# Patient Record
Sex: Female | Born: 1975 | Race: White | Hispanic: No | Marital: Married | Smoking: Never smoker
Health system: Southern US, Community
[De-identification: ages and names within clinical notes are randomized; demographics above are authoritative.]

## PROBLEM LIST (undated history)

## (undated) DIAGNOSIS — M719 Bursopathy, unspecified: Secondary | ICD-10-CM

## (undated) DIAGNOSIS — E2839 Other primary ovarian failure: Secondary | ICD-10-CM

## (undated) DIAGNOSIS — F419 Anxiety disorder, unspecified: Secondary | ICD-10-CM

## (undated) DIAGNOSIS — E079 Disorder of thyroid, unspecified: Secondary | ICD-10-CM

## (undated) DIAGNOSIS — Z1379 Encounter for other screening for genetic and chromosomal anomalies: Secondary | ICD-10-CM

## (undated) DIAGNOSIS — K219 Gastro-esophageal reflux disease without esophagitis: Secondary | ICD-10-CM

## (undated) DIAGNOSIS — Z803 Family history of malignant neoplasm of breast: Secondary | ICD-10-CM

## (undated) DIAGNOSIS — Z1371 Encounter for nonprocreative screening for genetic disease carrier status: Secondary | ICD-10-CM

## (undated) DIAGNOSIS — C801 Malignant (primary) neoplasm, unspecified: Secondary | ICD-10-CM

## (undated) DIAGNOSIS — Z9189 Other specified personal risk factors, not elsewhere classified: Secondary | ICD-10-CM

## (undated) DIAGNOSIS — E039 Hypothyroidism, unspecified: Secondary | ICD-10-CM

## (undated) DIAGNOSIS — E288 Other ovarian dysfunction: Secondary | ICD-10-CM

## (undated) HISTORY — DX: Other primary ovarian failure: E28.39

## (undated) HISTORY — DX: Bursopathy, unspecified: M71.9

## (undated) HISTORY — DX: Encounter for nonprocreative screening for genetic disease carrier status: Z13.71

## (undated) HISTORY — DX: Malignant (primary) neoplasm, unspecified: C80.1

## (undated) HISTORY — DX: Disorder of thyroid, unspecified: E07.9

## (undated) HISTORY — DX: Family history of malignant neoplasm of breast: Z80.3

## (undated) HISTORY — DX: Anxiety disorder, unspecified: F41.9

## (undated) HISTORY — DX: Gastro-esophageal reflux disease without esophagitis: K21.9

## (undated) HISTORY — DX: Other ovarian dysfunction: E28.8

## (undated) HISTORY — DX: Other specified personal risk factors, not elsewhere classified: Z91.89

## (undated) HISTORY — DX: Encounter for other screening for genetic and chromosomal anomalies: Z13.79

## (undated) HISTORY — PX: BASAL CELL CARCINOMA EXCISION: SHX1214

## (undated) HISTORY — PX: WISDOM TOOTH EXTRACTION: SHX21

---

## 2009-04-18 LAB — HM PAP SMEAR: HM PAP: NEGATIVE

## 2011-07-13 ENCOUNTER — Ambulatory Visit: Payer: Self-pay

## 2012-06-11 IMAGING — US US OUTSIDE FILMS BREAST
1 series · 5 of 5 positions shown · non-contrast
Comparison: none

[Series 1: us outside films breast · 5 of 5 slices shown]
[im 1/5]
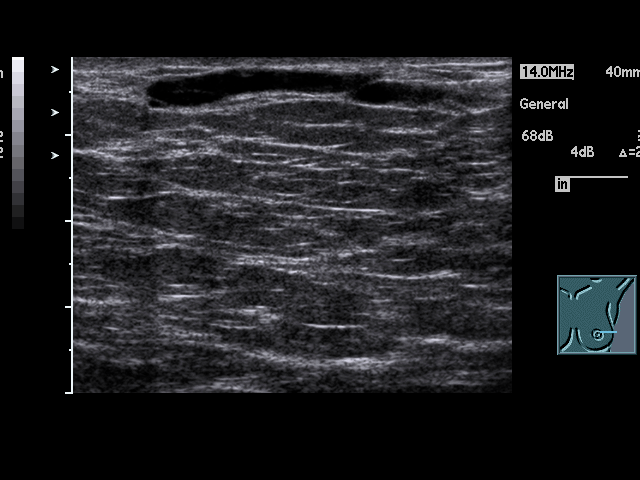
[im 2/5]
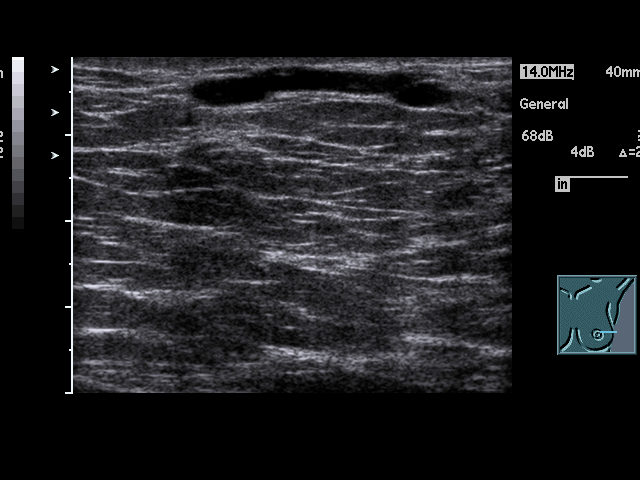
[im 3/5]
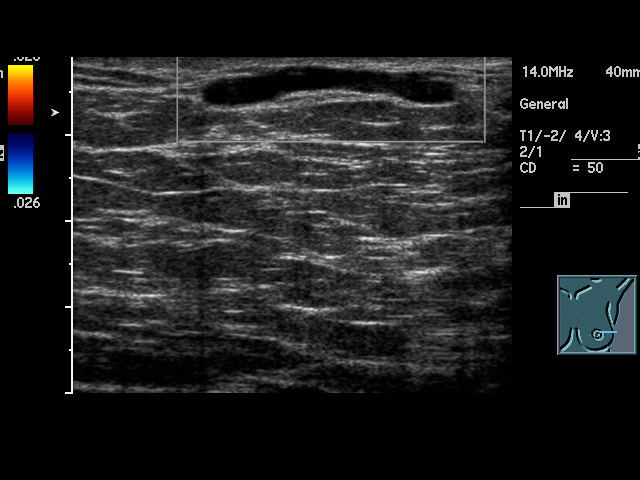
[im 4/5]
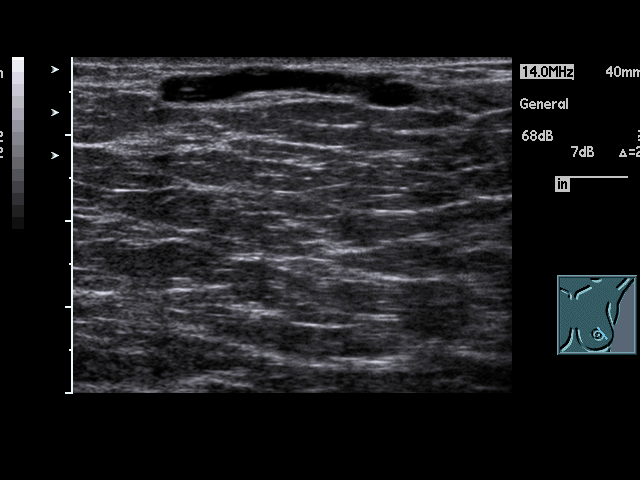
[im 5/5]
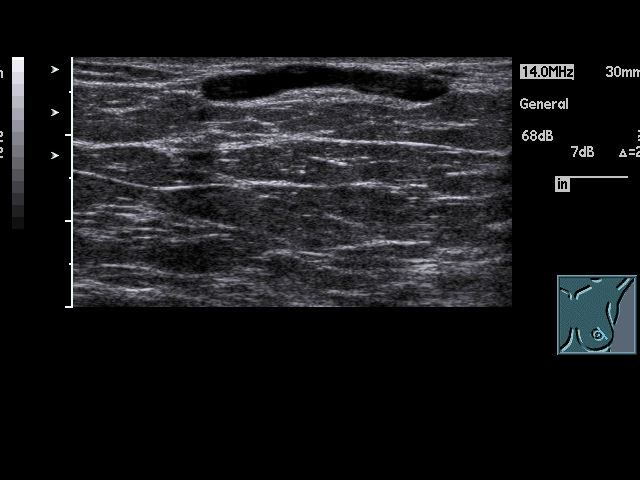

[5 of 5 positions shown; findings below may reference images not displayed]

IMAGES IMPORTED FROM THE [employer] WORKFLOW SYSTEM
NO DICTATION FOR STUDY

## 2012-08-05 DIAGNOSIS — Z803 Family history of malignant neoplasm of breast: Secondary | ICD-10-CM | POA: Insufficient documentation

## 2012-09-21 DIAGNOSIS — Z803 Family history of malignant neoplasm of breast: Secondary | ICD-10-CM | POA: Insufficient documentation

## 2012-10-15 HISTORY — PX: BREAST SURGERY: SHX581

## 2012-11-03 DIAGNOSIS — Z1239 Encounter for other screening for malignant neoplasm of breast: Secondary | ICD-10-CM | POA: Insufficient documentation

## 2012-11-25 ENCOUNTER — Ambulatory Visit: Payer: Self-pay | Admitting: Family Medicine

## 2013-05-24 ENCOUNTER — Ambulatory Visit: Payer: Self-pay

## 2014-02-22 DIAGNOSIS — E669 Obesity, unspecified: Secondary | ICD-10-CM | POA: Insufficient documentation

## 2014-02-22 DIAGNOSIS — E039 Hypothyroidism, unspecified: Secondary | ICD-10-CM | POA: Insufficient documentation

## 2014-11-02 ENCOUNTER — Ambulatory Visit: Payer: Self-pay | Admitting: Family Medicine

## 2014-12-17 IMAGING — CR DG CHEST 2V
1 series · 2 of 2 positions shown · non-contrast
Comparison: none

REASON FOR EXAM: chest pain
COMMENTS:

PROCEDURE:     MDR - MDR CHEST PA(OR AP) AND LATERAL  - November 25, 2012  [DATE]
RESULT:     Comparison: None

[Series 1: pa · 0.17mm/px · 2 of 2 slices shown]
[im 1/2]
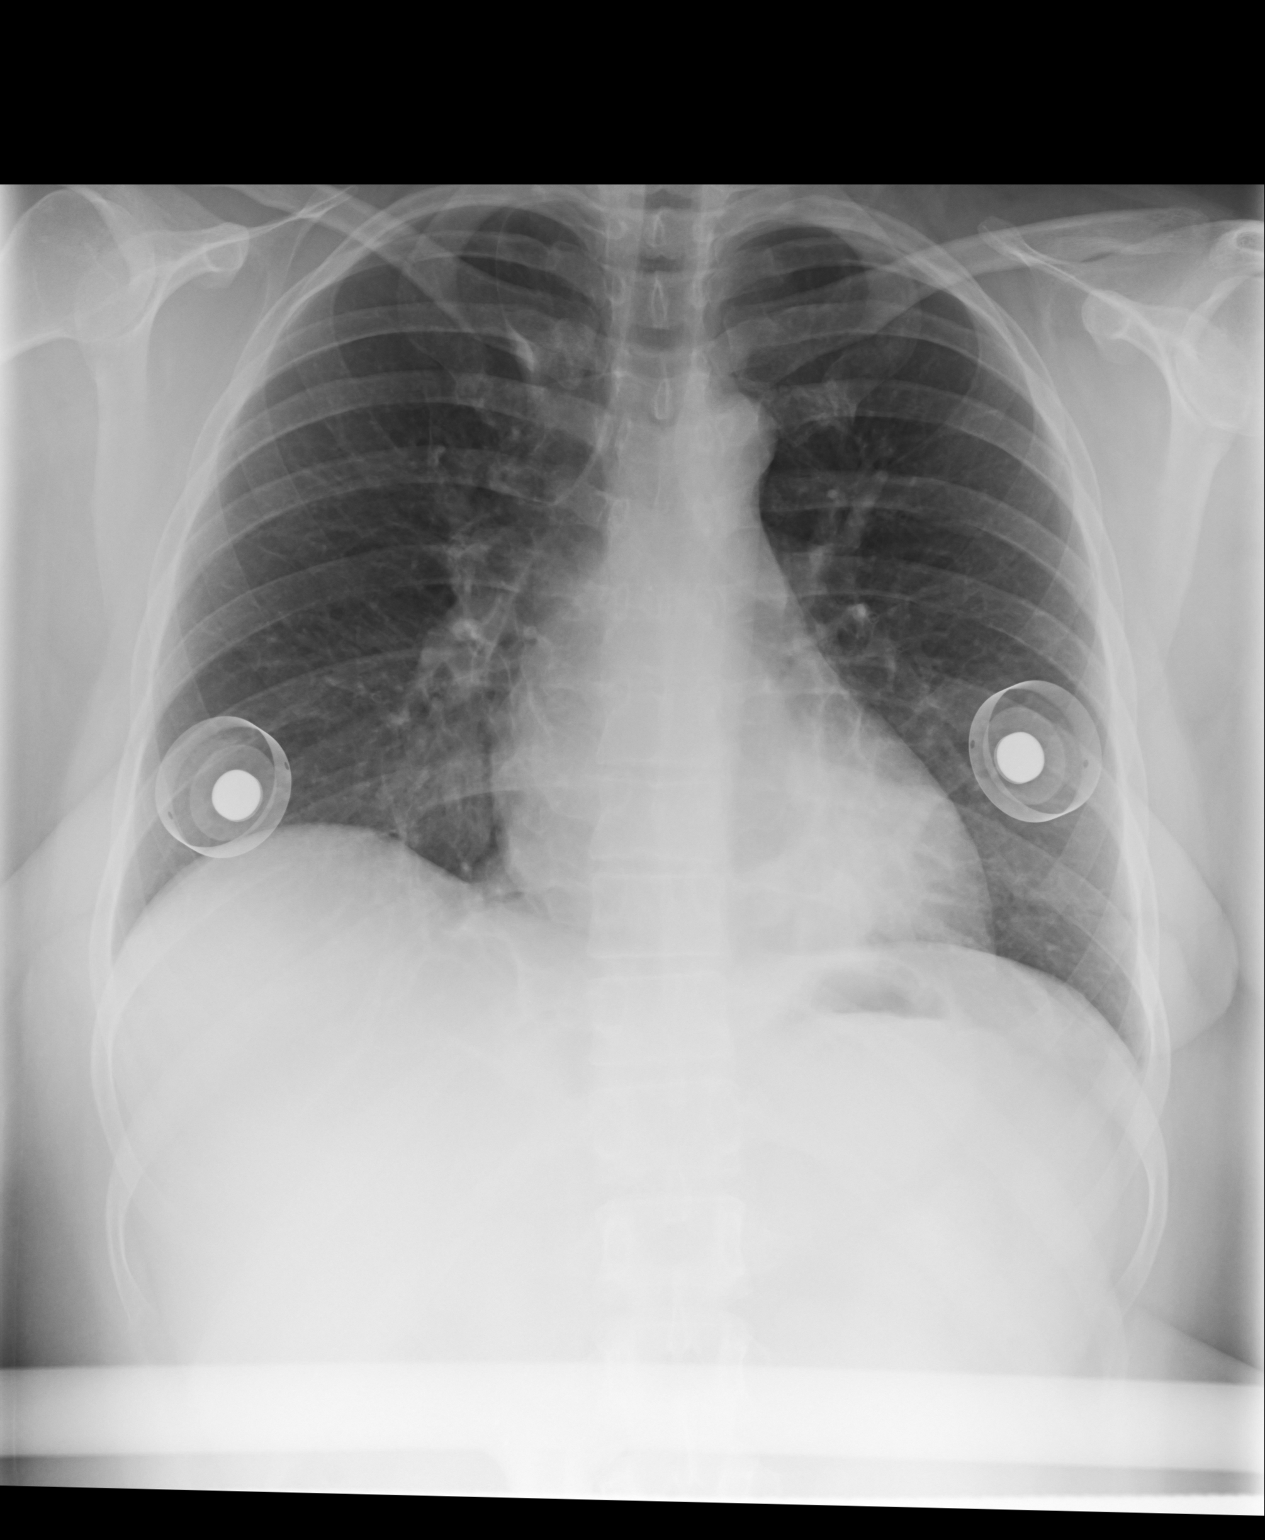
[im 2/2]
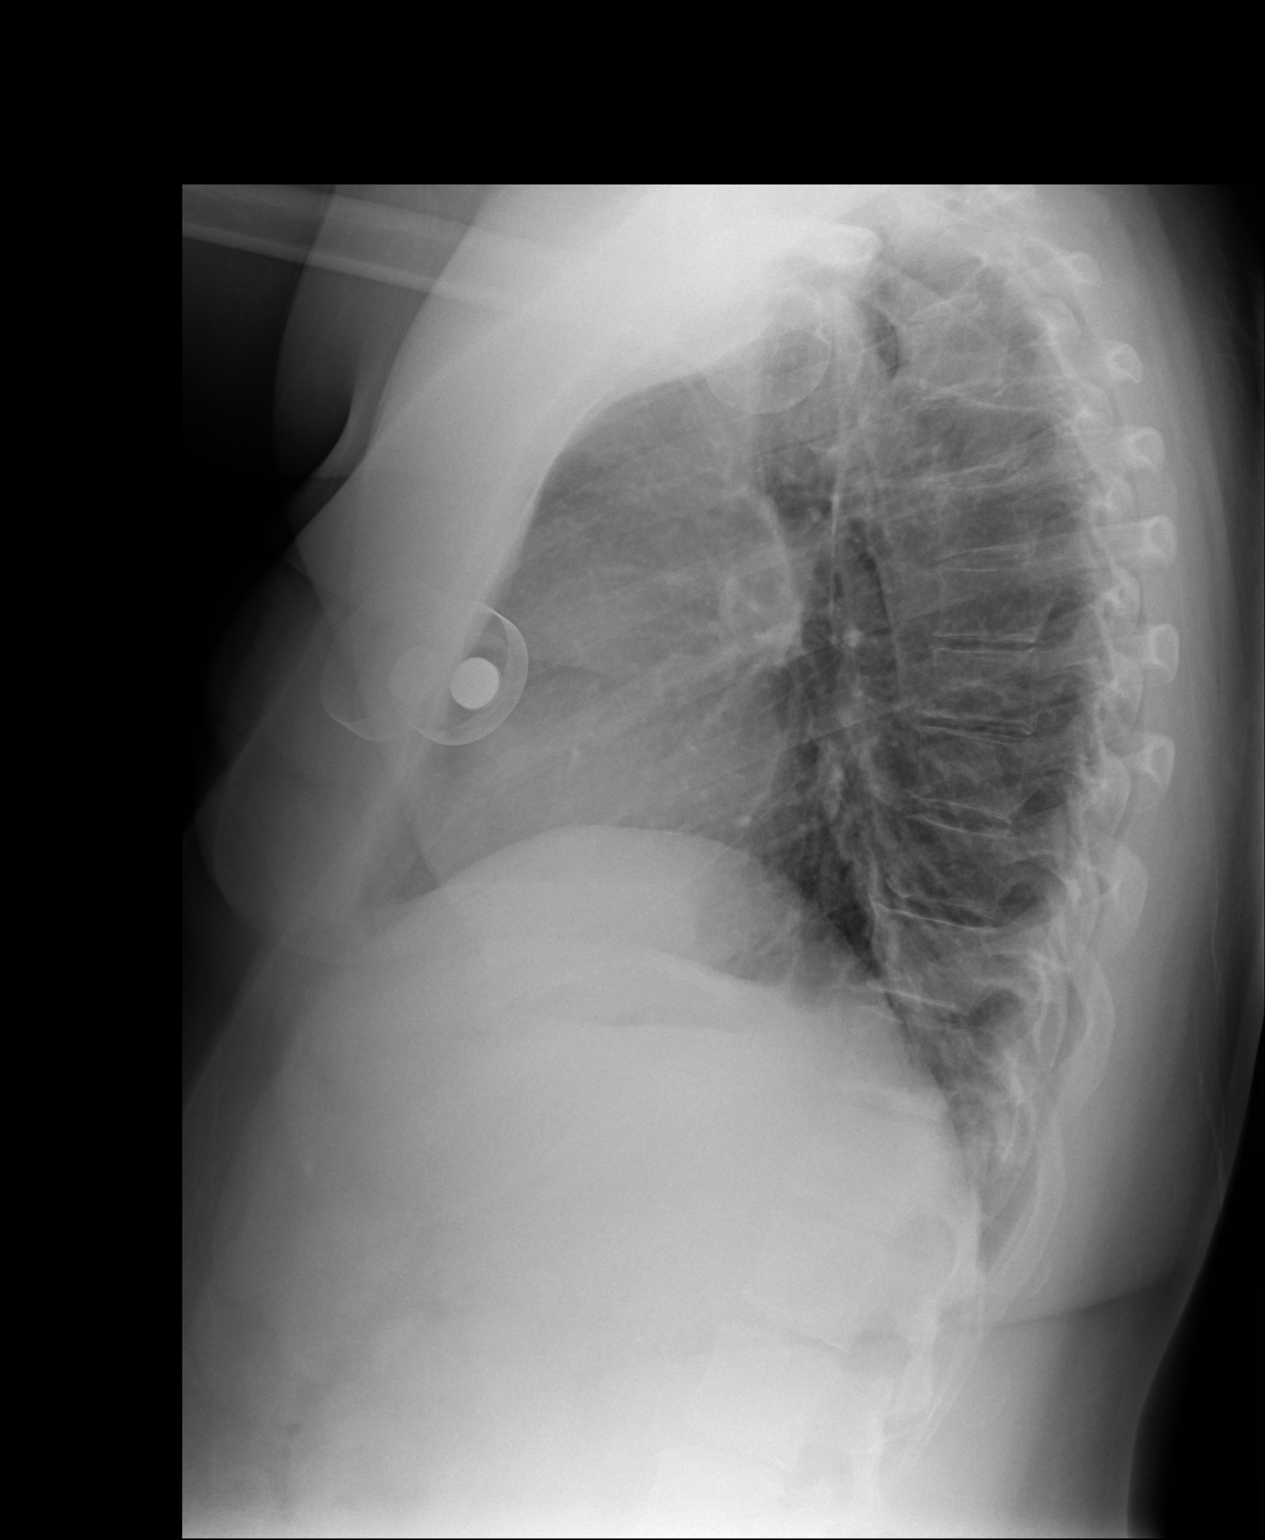

[2 of 2 positions shown; findings below may reference images not displayed]

FINDINGS: PA and lateral chest radiographs are provided.  There is no focal
parenchymal opacity, pleural effusion, or pneumothorax. The heart and
mediastinum are unremarkable.  The osseous structures are unremarkable.
IMPRESSION: No acute disease of the che[REDACTED]

## 2016-06-26 ENCOUNTER — Encounter: Payer: Self-pay | Admitting: Family Medicine

## 2016-06-26 ENCOUNTER — Ambulatory Visit (INDEPENDENT_AMBULATORY_CARE_PROVIDER_SITE_OTHER): Payer: 59 | Admitting: Family Medicine

## 2016-06-26 VITALS — BP 124/70 | HR 72 | Temp 98.2°F | Resp 16 | Ht 63.0 in | Wt 211.0 lb

## 2016-06-26 DIAGNOSIS — E039 Hypothyroidism, unspecified: Secondary | ICD-10-CM

## 2016-06-26 DIAGNOSIS — G5603 Carpal tunnel syndrome, bilateral upper limbs: Secondary | ICD-10-CM | POA: Diagnosis not present

## 2016-06-26 NOTE — Patient Instructions (Signed)
Hypothyroidism Hypothyroidism is a disorder of the thyroid. The thyroid is a large gland that is located in the lower front of the neck. The thyroid releases hormones that control how the body works. With hypothyroidism, the thyroid does not make enough of these hormones. CAUSES Causes of hypothyroidism may include:  Viral infections.  Pregnancy.  Your own defense system (immune system) attacking your thyroid.  Certain medicines.  Birth defects.  Past radiation treatments to your head or neck.  Past treatment with radioactive iodine.  Past surgical removal of part or all of your thyroid.  Problems with the gland that is located in the center of your brain (pituitary). SIGNS AND SYMPTOMS Signs and symptoms of hypothyroidism may include:  Feeling as though you have no energy (lethargy).  Inability to tolerate cold.  Weight gain that is not explained by a change in diet or exercise habits.  Dry skin.  Coarse hair.  Menstrual irregularity.  Slowing of thought processes.  Constipation.  Sadness or depression. DIAGNOSIS  Your health care provider may diagnose hypothyroidism with blood tests and ultrasound tests. TREATMENT Hypothyroidism is treated with medicine that replaces the hormones that your body does not make. After you begin treatment, it may take several weeks for symptoms to go away. HOME CARE INSTRUCTIONS   Take medicines only as directed by your health care provider.  If you start taking any new medicines, tell your health care provider.  Keep all follow-up visits as directed by your health care provider. This is important. As your condition improves, your dosage needs may change. You will need to have blood tests regularly so that your health care provider can watch your condition. SEEK MEDICAL CARE IF:  Your symptoms do not get better with treatment.  You are taking thyroid replacement medicine and:  You sweat excessively.  You have tremors.  You  feel anxious.  You lose weight rapidly.  You cannot tolerate heat.  You have emotional swings.  You have diarrhea.  You feel weak. SEEK IMMEDIATE MEDICAL CARE IF:   You develop chest pain.  You develop an irregular heartbeat.  You develop a rapid heartbeat.   This information is not intended to replace advice given to you by your health care provider. Make sure you discuss any questions you have with your health care provider.   Document Released: 08/03/2005 Document Revised: 08/24/2014 Document Reviewed: 12/19/2013 Elsevier Interactive Patient Education 2016 [place] Syndrome Carpal tunnel syndrome is a condition that causes pain in your hand and arm. The carpal tunnel is a narrow area located on the palm side of your wrist. Repeated wrist motion or certain diseases may cause swelling within the tunnel. This swelling pinches the main nerve in the wrist (median nerve). CAUSES  This condition may be caused by:   Repeated wrist motions.  Wrist injuries.  Arthritis.  A cyst or tumor in the carpal tunnel.  Fluid buildup during pregnancy. Sometimes the cause of this condition is not known.  RISK FACTORS This condition is more likely to develop in:   People who have jobs that cause them to repeatedly move their wrists in the same motion, such as butchers and cashiers.  Women.  People with certain conditions, such as:  Diabetes.  Obesity.  An underactive thyroid (hypothyroidism).  Kidney failure. SYMPTOMS  Symptoms of this condition include:   A tingling feeling in your fingers, especially in your thumb, index, and middle fingers.  Tingling or numbness in your hand.  An aching  feeling in your entire arm, especially when your wrist and elbow are bent for long periods of time.  Wrist pain that goes up your arm to your shoulder.  Pain that goes down into your palm or fingers.  A weak feeling in your hands. You may have trouble grabbing and  holding items. Your symptoms may feel worse during the night.  DIAGNOSIS  This condition is diagnosed with a medical history and physical exam. You may also have tests, including:   An electromyogram (EMG). This test measures electrical signals sent by your nerves into the muscles.  X-rays. TREATMENT  Treatment for this condition includes:  Lifestyle changes. It is important to stop doing or modify the activity that caused your condition.  Physical or occupational therapy.  Medicines for pain and inflammation. This may include medicine that is injected into your wrist.  A wrist splint.  Surgery. HOME CARE INSTRUCTIONS  If You Have a Splint:  Wear it as told by your health care provider. Remove it only as told by your health care provider.  Loosen the splint if your fingers become numb and tingle, or if they turn cold and blue.  Keep the splint clean and dry. General Instructions  Take over-the-counter and prescription medicines only as told by your health care provider.  Rest your wrist from any activity that may be causing your pain. If your condition is work related, talk to your employer about changes that can be made, such as getting a wrist pad to use while typing.  If directed, apply ice to the painful area:  Put ice in a plastic bag.  Place a towel between your skin and the bag.  Leave the ice on for 20 minutes, 2-3 times per day.  Keep all follow-up visits as told by your health care provider. This is important.  Do any exercises as told by your health care provider, physical therapist, or occupational therapist. [place] IF:   You have new symptoms.  Your pain is not controlled with medicines.  Your symptoms get worse.   This information is not intended to replace advice given to you by your health care provider. Make sure you discuss any questions you have with your health care provider.   Document Released: 07/31/2000 Document Revised:  04/24/2015 Document Reviewed: 12/19/2014 Elsevier Interactive Patient Education [employer].

## 2016-06-26 NOTE — Progress Notes (Signed)
Patient: Gloria Li Female    DOB: 07/17/1976   40 y.o.   MRN: [id number] Visit Date: 06/26/2016  Today's Provider: Vernie Murders, PA   Chief Complaint  Patient presents with  . Hypothyroidism  . Hand Problem    numbness and tingling in thumbs   Subjective:    HPI Pt is here today to discuss lab work for her endocrinologist and see if she can start getting her rx filled through [place]. She has labs that were done in May.  Her thyroid has been stable. The only problem she is having today is she has numbness and tingling in her hands when she is doing anything. 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 She reports that it has been going on for a while but has gotten worse over the last year.     Patient Active Problem List   Diagnosis Date Noted  . Hypothyroidism 02/22/2014  . Obesity 02/22/2014  . Breast cancer screening, high risk patient 11/03/2012  . Family history of breast cancer in mother 09/21/2012  . Family history of breast cancer in first degree relative 08/05/2012   Past Surgical History:  Procedure Laterality Date  . BREAST SURGERY Bilateral 10/2012   Mother had breast cancer masectomy was for prophylactically. later in 2014 she had breast implants.   . CESAREAN SECTION  2002   Family History  Problem Relation Age of Onset  . Cancer Mother   . Glaucoma Father     Allergies  Allergen Reactions  . Penicillins Rash    As an infant  . Sulfa Antibiotics Rash    Current Outpatient Prescriptions:  .  Cholecalciferol (VITAMIN D3) 1000 units CAPS, Take by mouth daily., Disp: , Rfl:  .  Cyanocobalamin (VITAMIN B 12 PO), Take by mouth., Disp: , Rfl:  .  levothyroxine (SYNTHROID, LEVOTHROID) 100 MCG tablet, Take by mouth., Disp: , Rfl:   Review of Systems  Constitutional: Negative.   HENT: Negative.   Eyes: Negative.   Respiratory: Negative.   Cardiovascular: Negative.   Gastrointestinal: Negative.     Endocrine: Negative.   Genitourinary: Negative.   Musculoskeletal: Negative.   Skin: Negative.   Allergic/Immunologic: Negative.   Neurological: Positive for numbness.  Hematological: Negative.   Psychiatric/Behavioral: Negative.     Social History  Substance Use Topics  . Smoking status: Never Smoker  . Smokeless tobacco: Never Used  . Alcohol use No   Objective:   BP 124/70 (BP Location: Right Arm, Patient Position: Sitting, Cuff Size: Large)   Pulse 72   Temp 98.2 F (36.8 C) (Oral)   Resp 16   Ht 5\' 3"  (1.6 m)   Wt 211 lb (95.7 kg)   BMI 37.38 kg/m   Physical Exam  Constitutional: She is oriented to person, place, and time. She appears well-developed and well-nourished. No distress.  HENT:  Head: Normocephalic and atraumatic.  Right Ear: Hearing normal.  Left Ear: Hearing normal.  Nose: Nose normal.  Eyes: Conjunctivae and lids are normal. Right eye exhibits no discharge. Left eye exhibits no discharge. No scleral icterus.  Neck: Neck supple. Thyromegaly present.  Cardiovascular: Normal rate and regular rhythm.   Pulmonary/Chest: Effort normal and breath sounds normal. No respiratory distress.  Abdominal: Soft. Bowel sounds are normal.  Musculoskeletal: Normal range of motion.  Neurological: She is alert and oriented to person, place, and time. She has normal reflexes.  Bilateral tingling/numbness in both hands in the median nerve distribution. DTR's are symmetric. Questionably positive Phalen signs. Grip is symmetric with normal 2+ pulses.  Skin: Skin is intact. No lesion and no rash noted.  Psychiatric: She has a normal mood and affect. Her speech is normal and behavior is normal. Thought content normal.      Assessment & Plan:     1. Bilateral carpal tunnel syndrome Progressive tingling/numbness in both hands over the past few weeks. Suspect CTS from repetitive use at work typing. Recommend using wrist splints for support and OTC NSAID of choice. If no better  in the next 3-4 weeks, will need referral to hand specialist for possible cortisone injections.  2. Hypothyroidism, unspecified type Tolerating Levothyroxine 100 mcg qd prescribed by Dr. Eddie Dibbles (endocrinologist). TSH was 2.020 on 12-25-15. Denies tremor, palpitations, sweats, constipation, edema or hair loss. Continue present dosage and follow up every 6 months.       Vernie Murders, PA  [facility]

## 2016-08-04 ENCOUNTER — Encounter: Payer: Self-pay | Admitting: Family Medicine

## 2016-09-01 ENCOUNTER — Ambulatory Visit (INDEPENDENT_AMBULATORY_CARE_PROVIDER_SITE_OTHER): Payer: 59 | Admitting: Family Medicine

## 2016-09-01 ENCOUNTER — Encounter: Payer: Self-pay | Admitting: Family Medicine

## 2016-09-01 VITALS — BP 104/76 | HR 68 | Temp 97.8°F | Resp 14 | Wt 206.2 lb

## 2016-09-01 DIAGNOSIS — F4323 Adjustment disorder with mixed anxiety and depressed mood: Secondary | ICD-10-CM | POA: Diagnosis not present

## 2016-09-01 MED ORDER — SERTRALINE HCL 50 MG PO TABS: 50.0000 mg | ORAL_TABLET | Freq: Every day | ORAL | 3 refills | Status: DC

## 2016-09-01 NOTE — Progress Notes (Signed)
Patient: Gloria Li Female    DOB: November 25, 1975   41 y.o.   MRN: [id number] Visit Date: 09/01/2016  Today's Provider: Vernie Murders, PA   Chief Complaint  Patient presents with  . Anxiety   Subjective:    Anxiety  Presents for initial visit. Episode onset: Friday morning. Progression since onset: slight improvement. Symptoms include depressed mood, excessive worry, irritability, malaise, nausea, nervous/anxious behavior, panic and restlessness. Primary symptoms comment: appetite change, uncontrollable crying, sleeping more. The severity of symptoms is interfering with daily activities. The symptoms are aggravated by work stress. The quality of sleep is poor. Nighttime awakenings: one to two.   Risk factors include a major life event (change positions at work). Past treatments include nothing.   Past Medical History:  Diagnosis Date  . Thyroid disease    Patient Active Problem List   Diagnosis Date Noted  . Hypothyroidism 02/22/2014  . Obesity 02/22/2014  . Breast cancer screening, high risk patient 11/03/2012  . Family history of breast cancer in mother 09/21/2012  . Family history of breast cancer in first degree relative 08/05/2012   Past Surgical History:  Procedure Laterality Date  . BREAST SURGERY Bilateral 10/2012   Mother had breast cancer masectomy was for prophylactically. later in 2014 she had breast implants.   . CESAREAN SECTION  2002   Family History  Problem Relation Age of Onset  . Cancer Mother   . Glaucoma Father    Allergies  Allergen Reactions  . Penicillins Rash    As an infant  . Sulfa Antibiotics Rash     Previous Medications   CHOLECALCIFEROL (VITAMIN D3) 1000 UNITS CAPS    Take by mouth daily.   CYANOCOBALAMIN (VITAMIN B 12 PO)    Take by mouth.   LEVOTHYROXINE (SYNTHROID, LEVOTHROID) 100 MCG TABLET    Take by mouth.   PHENTERMINE 30 MG CAPSULE    TK 1 C PO QAM BEFORE BREAKFAST    Review of Systems  Constitutional: Positive for  irritability.  Respiratory: Negative.   Cardiovascular: Negative.   Gastrointestinal: Positive for nausea.  Psychiatric/Behavioral: Positive for agitation, dysphoric mood and sleep disturbance. The patient is nervous/anxious.     Social History  Substance Use Topics  . Smoking status: Never Smoker  . Smokeless tobacco: Never Used  . Alcohol use No   Objective:   BP 104/76 (BP Location: Right Arm, Patient Position: Sitting, Cuff Size: Normal)   Pulse 68   Temp 97.8 F (36.6 C) (Oral)   Resp 14   Wt 206 lb 3.2 oz (93.5 kg)   BMI 36.53 kg/m  LMP 12 years ago.  Physical Exam  Constitutional: She is oriented to person, place, and time. She appears well-developed and well-nourished. No distress.  HENT:  Head: Normocephalic and atraumatic.  Right Ear: Hearing and external ear normal.  Left Ear: Hearing and external ear normal.  Nose: Nose normal.  Mouth/Throat: Oropharynx is clear and moist.  Eyes: Conjunctivae and lids are normal. Right eye exhibits no discharge. Left eye exhibits no discharge. No scleral icterus.  Neck: Neck supple. No thyromegaly present.  Cardiovascular: Normal rate and regular rhythm.   Pulmonary/Chest: Effort normal. No respiratory distress.  Abdominal: Soft. Bowel sounds are normal.  Musculoskeletal: Normal range of motion.  Lymphadenopathy:    She has no cervical adenopathy.  Neurological: She is alert and oriented to person, place, and time. She has normal reflexes.  Skin: Skin is intact. No lesion and no rash noted.  Psychiatric:  Her speech is normal and behavior is normal. Thought content normal. Her mood appears anxious. She exhibits a depressed mood.      Assessment & Plan:     1. Adjustment reaction with anxiety and depression Onset over the past weekend with anxiety, spontaneous crying spells, hypersomnia and feeling sad. This is also close to the anniversary of her mother's death, this patient's bilateral mastectomies and she is in the process  of selling her home. 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 Advised her to get into counseling through [facility], eat 3 meals a day and exercise regularly. Will give Sertraline 50 mg qd and recheck in 2 weeks. - sertraline (ZOLOFT) 50 MG tablet; Take 1 tablet (50 mg total) by mouth daily.  Dispense: 30 tablet; Refill: 3

## 2016-09-01 NOTE — Patient Instructions (Signed)
Generalized Anxiety Disorder Generalized anxiety disorder (GAD) is a mental disorder. It interferes with life functions, including relationships, work, and school. GAD is different from normal anxiety, which everyone experiences at some point in their lives in response to specific life events and activities. Normal anxiety actually helps [place] prepare for and get through these life events and activities. Normal anxiety goes away after the event or activity is over.  GAD causes anxiety that is not necessarily related to specific events or activities. It also causes excess anxiety in proportion to specific events or activities. The anxiety associated with GAD is also difficult to control. GAD can vary from mild to severe. People with severe GAD can have intense waves of anxiety with physical symptoms (panic attacks).  SYMPTOMS The anxiety and worry associated with GAD are difficult to control. This anxiety and worry are related to many life events and activities and also occur more days than not for 6 months or longer. People with GAD also have three or more of the following symptoms (one or more in children):  Restlessness.   Fatigue.  Difficulty concentrating.   Irritability.  Muscle tension.  Difficulty sleeping or unsatisfying sleep. DIAGNOSIS GAD is diagnosed through an assessment by your health care provider. Your health care provider will ask you questions aboutyour mood,physical symptoms, and events in your life. Your health care provider may ask you about your medical history and use of alcohol or drugs, including prescription medicines. Your health care provider may also do a physical exam and blood tests. Certain medical conditions and the use of certain substances can cause symptoms similar to those associated with GAD. Your health care provider may refer you to a mental health specialist for further evaluation. TREATMENT The following therapies are usually used to treat GAD:    Medication. Antidepressant medication usually is prescribed for long-term daily control. Antianxiety medicines may be added in severe cases, especially when panic attacks occur.   Talk therapy (psychotherapy). Certain types of talk therapy can be helpful in treating GAD by providing support, education, and guidance. A form of talk therapy called cognitive behavioral therapy can teach you healthy ways to think about and react to daily life events and activities.  Stress managementtechniques. These include yoga, meditation, and exercise and can be very helpful when they are practiced regularly. A mental health specialist can help determine which treatment is best for you. Some people see improvement with one therapy. However, other people require a combination of therapies. This information is not intended to replace advice given to you by your health care provider. Make sure you discuss any questions you have with your health care provider. Document Released: 11/28/2012 Document Revised: 08/24/2014 Document Reviewed: 11/28/2012 Elsevier Interactive Patient Education  2017 [place]. Major Depressive Disorder, Adult Major depressive disorder (MDD) is a mental health condition. It may also be called clinical depression or unipolar depression. MDD usually causes feelings of sadness, hopelessness, or helplessness. MDD can also cause physical symptoms. It can interfere with work, school, relationships, and other everyday activities. MDD may be mild, moderate, or severe. It may occur once (single episode major depressive disorder) or it may occur multiple times (recurrent major depressive disorder). What are the causes? The exact cause of this condition is not known. MDD is most likely caused by a combination of things, which may include:  Genetic factors. These are traits that are passed along from parent to child.  Individual factors. Your personality, your behavior, and the way you handle your  thoughts and feelings may contribute to MDD. This includes personality traits and behaviors learned from others.  Physical factors, such as:  Differences in the part of your brain that controls emotion. This part of your brain may be different than it is in people who do not have MDD.  Long-term (chronic) medical or psychiatric illnesses.  Social factors. Traumatic experiences or major life changes may play a role in the development of MDD. What increases the risk? This condition is more likely to develop in women. The following factors may also make you more likely to develop MDD:  A family history of depression.  Troubled family relationships.  Abnormally low levels of certain brain chemicals.  Traumatic events in childhood, especially abuse or the loss of a parent.  Being under a lot of stress, or long-term stress, especially from upsetting life experiences or losses.  A history of:  Chronic physical illness.  Other mental health disorders.  Substance abuse.  Poor living conditions.  Experiencing social exclusion or discrimination on a regular basis. What are the signs or symptoms? The main symptoms of MDD typically include:  Constant depressed or irritable mood.  Loss of interest in things and activities. MDD symptoms may also include:  Sleeping or eating too much or too little.  Unexplained weight change.  Fatigue or low energy.  Feelings of worthlessness or guilt.  Difficulty thinking clearly or making decisions.  Thoughts of suicide or of harming others.  Physical agitation or weakness.  Isolation. Severe cases of MDD may also occur with other symptoms, such as:  Delusions or hallucinations, in which you imagine things that are not real (psychotic depression).  Low-level depression that lasts at least a year (chronic depression or persistent depressive disorder).  Extreme sadness and hopelessness (melancholic depression).  Trouble speaking and  moving (catatonic depression). How is this diagnosed? This condition may be diagnosed based on:  Your symptoms.  Your medical history, including your mental health history. This may involve tests to evaluate your mental health. You may be asked questions about your lifestyle, including any drug and alcohol use, and how long you have had symptoms of MDD.  A physical exam.  Blood tests to rule out other conditions. You must have a depressed mood and at least four other MDD symptoms most of the day, nearly every day in the same 2-week timeframe before your health care provider can confirm a diagnosis of MDD. How is this treated? This condition is usually treated by mental health professionals, such as psychologists, psychiatrists, and clinical social workers. You may need more than one type of treatment. Treatment may include:  Psychotherapy. This is also called talk therapy or counseling. Types of psychotherapy include:  Cognitive behavioral therapy (CBT). This type of therapy teaches you to recognize unhealthy feelings, thoughts, and behaviors, and replace them with positive thoughts and actions.  Interpersonal therapy (IPT). This helps you to improve the way you relate to and communicate with others.  Family therapy. This treatment includes members of your family.  Medicine to treat anxiety and depression, or to help you control certain emotions and behaviors.  Lifestyle changes, such as:  Limiting alcohol and drug use.  Exercising regularly.  Getting plenty of sleep.  Making healthy eating choices.  Spending more time outdoors. Treatments involving stimulation of the brain can be used in situations with extremely severe symptoms, or when medicine or other therapies do not work over time. These treatments include electroconvulsive therapy, transcranial magnetic stimulation, and vagal nerve stimulation.  Follow these instructions at home: Activity  Return to your normal  activities as told by your health care provider.  Exercise regularly and spend time outdoors as told by your health care provider. General instructions  Take over-the-counter and prescription medicines only as told by your health care provider.  Do not drink alcohol. If you drink alcohol, limit your alcohol intake to no more than 1 drink a day for nonpregnant women and 2 drinks a day for men. One drink equals 12 oz of beer, 5 oz of wine, or 1 oz of hard liquor. Alcohol can affect any antidepressant medicines you are taking. Talk to your health care provider about your alcohol use.  Eat a healthy diet and get plenty of sleep.  Find activities that you enjoy doing, and make time to do them.  Consider joining a support group. Your health care provider may be able to recommend a support group.  Keep all follow-up visits as told by your health care provider. This is important. Where to find more information: [employer] on Mental Illness  www.nami.org [place]. National Institute of Mental Health  https://carter.com/ National Suicide Prevention Lifeline  1-800-273-TALK 423-226-2400). This is free, 24-hour help. Contact a health care provider if:  Your symptoms get worse.  You develop new symptoms. Get help right away if:  You self-harm.  You have serious thoughts about hurting yourself or others.  You see, hear, taste, smell, or feel things that are not present (hallucinate). This information is not intended to replace advice given to you by your health care provider. Make sure you discuss any questions you have with your health care provider. Document Released: 11/28/2012 Document Revised: 04/09/2016 Document Reviewed: 02/12/2016 Elsevier Interactive Patient Education  2017 [employer].

## 2016-09-15 ENCOUNTER — Encounter: Payer: Self-pay | Admitting: Family Medicine

## 2016-09-15 ENCOUNTER — Ambulatory Visit (INDEPENDENT_AMBULATORY_CARE_PROVIDER_SITE_OTHER): Payer: 59 | Admitting: Family Medicine

## 2016-09-15 VITALS — BP 110/80 | HR 68 | Temp 98.4°F | Resp 16 | Wt 208.0 lb

## 2016-09-15 DIAGNOSIS — F4323 Adjustment disorder with mixed anxiety and depressed mood: Secondary | ICD-10-CM | POA: Diagnosis not present

## 2016-09-15 NOTE — Progress Notes (Signed)
Patient: Gloria Li Female    DOB: 12-12-1975   41 y.o.   MRN: [id number] Visit Date: 09/15/2016  Today's Provider: Vernie Murders, PA   Chief Complaint  Patient presents with  . Anxiety  . Depression   Subjective:    HPI  Depression/Anxiety, Follow-up  She  was last seen for this 2 weeks ago. Changes made at last visit include start Sertraline 50 mg qd.   She reports excellent compliance with treatment. She is not having side effects.   She reports excellent tolerance of treatment. Current symptoms include: none She feels she is Improved since last visit.  ------------------------------------------------------------------------ Past Surgical History:  Procedure Laterality Date  . BREAST SURGERY Bilateral 10/2012   Mother had breast cancer masectomy was for prophylactically. later in 2014 she had breast implants.   . CESAREAN SECTION  2002    Family History  Problem Relation Age of Onset  . Cancer Mother   . Glaucoma Father     Allergies  Allergen Reactions  . Penicillins Rash    As an infant  . Sulfa Antibiotics Rash   Patient Active Problem List   Diagnosis Date Noted  . Hypothyroidism 02/22/2014  . Obesity 02/22/2014  . Breast cancer screening, high risk patient 11/03/2012  . Family history of breast cancer in mother 09/21/2012  . Family history of breast cancer in first degree relative 08/05/2012    Current Outpatient Prescriptions:  .  Cholecalciferol (VITAMIN D3) 1000 units CAPS, Take by mouth daily., Disp: , Rfl:  .  Cyanocobalamin (VITAMIN B 12 PO), Take 1 tablet by mouth daily. , Disp: , Rfl:  .  levothyroxine (SYNTHROID, LEVOTHROID) 100 MCG tablet, Take by mouth., Disp: , Rfl:  .  sertraline (ZOLOFT) 50 MG tablet, Take 1 tablet (50 mg total) by mouth daily., Disp: 30 tablet, Rfl: 3  Review of Systems  Constitutional: Negative.   Respiratory: Negative.   Cardiovascular: Negative.   Psychiatric/Behavioral: Negative.      Social History  Substance Use Topics  . Smoking status: Never Smoker  . Smokeless tobacco: Never Used  . Alcohol use No   Objective:   BP 110/80 (BP Location: Right Arm, Patient Position: Sitting, Cuff Size: Large)   Pulse 68   Temp 98.4 F (36.9 C) (Oral)   Resp 16   Wt 208 lb (94.3 kg)   SpO2 97%   BMI 36.85 kg/m   Physical Exam  Constitutional: She is oriented to person, place, and time. She appears well-developed and well-nourished. No distress.  HENT:  Head: Normocephalic and atraumatic.  Right Ear: Hearing normal.  Left Ear: Hearing normal.  Nose: Nose normal.  Eyes: Conjunctivae and lids are normal. Right eye exhibits no discharge. Left eye exhibits no discharge. No scleral icterus.  Pulmonary/Chest: Effort normal. No respiratory distress.  Musculoskeletal: Normal range of motion.  Neurological: She is alert and oriented to person, place, and time.  Skin: Skin is intact. No lesion and no rash noted.  Psychiatric: She has a normal mood and affect. Her speech is normal and behavior is normal. Thought content normal.      Assessment & Plan:     1. Adjustment reaction with anxiety and depression Much improved. Tolerating Sertraline 50 mg qd without daytime drowsiness. Eating well, good energy level, less anxiety and sadness. Employer has started the process of getting her back in her old position which was less stressful. Continue present Zoloft dosage and recheck in 3 months.  Vernie Murders, PA  [facility]

## 2016-10-30 ENCOUNTER — Other Ambulatory Visit: Payer: Self-pay | Admitting: Family Medicine

## 2016-10-30 ENCOUNTER — Telehealth: Payer: Self-pay | Admitting: Family Medicine

## 2016-10-30 DIAGNOSIS — F4323 Adjustment disorder with mixed anxiety and depressed mood: Secondary | ICD-10-CM

## 2016-10-30 MED ORDER — SERTRALINE HCL 50 MG PO TABS: 50.0000 mg | ORAL_TABLET | Freq: Every day | ORAL | 3 refills | Status: DC

## 2016-10-30 NOTE — Telephone Encounter (Signed)
Pt contacted office for refill request on the following medications:  sertraline (ZOLOFT) 50 MG tablet.  Pt is reqesting this sent to [facility] mail order.  [id number]

## 2016-10-30 NOTE — Telephone Encounter (Signed)
Done

## 2016-11-02 NOTE — Telephone Encounter (Signed)
Left patient a voicemail advising her that RX has been sent to pharmacy.

## 2016-12-01 ENCOUNTER — Ambulatory Visit (INDEPENDENT_AMBULATORY_CARE_PROVIDER_SITE_OTHER): Payer: 59 | Admitting: Family Medicine

## 2016-12-01 ENCOUNTER — Encounter: Payer: Self-pay | Admitting: Family Medicine

## 2016-12-01 VITALS — BP 112/82 | HR 87 | Temp 98.6°F | Wt 208.0 lb

## 2016-12-01 DIAGNOSIS — M25512 Pain in left shoulder: Secondary | ICD-10-CM

## 2016-12-01 DIAGNOSIS — R091 Pleurisy: Secondary | ICD-10-CM | POA: Diagnosis not present

## 2016-12-01 DIAGNOSIS — Z9013 Acquired absence of bilateral breasts and nipples: Secondary | ICD-10-CM | POA: Insufficient documentation

## 2016-12-01 NOTE — Progress Notes (Signed)
Patient: Gloria Li Female    DOB: [birth year]   41 y.o.   MRN: [id number] Visit Date: 12/01/2016  Today's Provider: Vernie Murders, PA   Chief Complaint  Patient presents with  . Follow-up   Subjective:    HPI Patient went to be evaluated for a muscle strain on 11/25/2016 at [facility]. A chest x-ray was done and patient is here to discuss results. She was told the x-ray showed streaky lung density of left lung base and advised to follow up with PCP for repeat x-ray in 2-3 weeks.. Patient brought x-ray report with her today.   Past Medical History:  Diagnosis Date  . Thyroid disease    Past Surgical History:  Procedure Laterality Date  . BREAST SURGERY Bilateral 10/2012   Mother had breast cancer masectomy was for prophylactically. later in 2014 she had breast implants.   . CESAREAN SECTION  2002   Family History  Problem Relation Age of Onset  . Cancer Mother   . Glaucoma Father    Allergies  Allergen Reactions  . Penicillins Rash    As an infant  . Sulfa Antibiotics Rash   Previous Medications   CHOLECALCIFEROL (VITAMIN D3) 1000 UNITS CAPS    Take by mouth daily.   CYANOCOBALAMIN (VITAMIN B 12 PO)    Take 1 tablet by mouth daily.    CYCLOBENZAPRINE (FLEXERIL) 5 MG TABLET    Take by mouth.   ETODOLAC (LODINE) 500 MG TABLET    Take by mouth.   LEVOTHYROXINE (SYNTHROID, LEVOTHROID) 100 MCG TABLET    Take by mouth.   SERTRALINE (ZOLOFT) 50 MG TABLET    Take 1 tablet (50 mg total) by mouth daily.    Review of Systems  Constitutional: Negative.   Respiratory: Negative.   Cardiovascular: Negative.     Social History  Substance Use Topics  . Smoking status: Never Smoker  . Smokeless tobacco: Never Used  . Alcohol use No   Objective:   BP 112/82 (BP Location: Right Arm, Patient Position: Sitting, Cuff Size: Normal)   Pulse 87   Temp 98.6 F (37 C) (Oral)   Wt 208 lb (94.3 kg)   SpO2 98%   BMI 36.85 kg/m   Physical Exam    Constitutional: She is oriented to person, place, and time. She appears well-developed and well-nourished. No distress.  HENT:  Head: Normocephalic and atraumatic.  Right Ear: Hearing normal.  Left Ear: Hearing normal.  Nose: Nose normal.  Eyes: Conjunctivae and lids are normal. Right eye exhibits no discharge. Left eye exhibits no discharge. No scleral icterus.  Neck: Neck supple.  Cardiovascular: Normal rate and regular rhythm.   Pulmonary/Chest: Effort normal and breath sounds normal. No respiratory distress. She exhibits no tenderness.  Abdominal: Soft. Bowel sounds are normal.  Musculoskeletal: Normal range of motion.  Neurological: She is alert and oriented to person, place, and time.  Skin: Skin is intact. No lesion and no rash noted.  Psychiatric: She has a normal mood and affect. Her speech is normal and behavior is normal. Thought content normal.      Assessment & Plan:     1. Pleurisy Slight soreness in left posterior base without signs of shingles rash. No fever, cough or congestion. CXR at [facility] showed some streaky density in the left lung base. Worried about possible cancer. No dyspnea, wheeze or chest pains. Will check CBC for signs of infection. Recheck in 2 weeks and repeat chest x-ray  as recommended by radiologist. - CBC with Differential/Platelet  2. Left shoulder pain, unspecified chronicity Onset 11-25-16 after carrying a vacuum cleaner down some stairs in her left hand. Was evaluated at [facility] and treated with Lodine and Flexeril. Feeling much improved with the use of the Lodine. Not using he Flexeril now. Recheck prn.  3. History of mastectomy, bilateral Had prophylactic mastectomies in 2014 with positive first degree relative having breast cancer (mother died age 86 after recurrence of metastatic breast cancer). Her BRCA was negative.

## 2016-12-02 ENCOUNTER — Encounter: Payer: Self-pay | Admitting: Family Medicine

## 2016-12-02 ENCOUNTER — Telehealth: Payer: Self-pay

## 2016-12-02 LAB — CBC WITH DIFFERENTIAL/PLATELET
BASOS ABS: 0 10*3/uL (ref 0.0–0.2)
Basos: 0 %
EOS (ABSOLUTE): 0.1 10*3/uL (ref 0.0–0.4)
Eos: 2 %
HEMOGLOBIN: 14.7 g/dL (ref 11.1–15.9)
Hematocrit: 44.5 % (ref 34.0–46.6)
IMMATURE GRANS (ABS): 0 10*3/uL (ref 0.0–0.1)
Immature Granulocytes: 0 %
LYMPHS: 20 %
Lymphocytes Absolute: 1.8 10*3/uL (ref 0.7–3.1)
MCH: 28.9 pg (ref 26.6–33.0)
MCHC: 33 g/dL (ref 31.5–35.7)
MCV: 88 fL (ref 79–97)
MONOCYTES: 6 %
Monocytes Absolute: 0.6 10*3/uL (ref 0.1–0.9)
NEUTROS PCT: 72 %
Neutrophils Absolute: 6.4 10*3/uL (ref 1.4–7.0)
PLATELETS: 271 10*3/uL (ref 150–379)
RBC: 5.08 x10E6/uL (ref 3.77–5.28)
RDW: 13.8 % (ref 12.3–15.4)
WBC: 9 10*3/uL (ref 3.4–10.8)

## 2016-12-02 NOTE — Telephone Encounter (Signed)
Patient advised as directed below. Per patient she is going to take OTC medication.  Thanks,  -Shareka Casale

## 2016-12-02 NOTE — Telephone Encounter (Signed)
Yes discontinue use. Will add to allergy list. Has she tried any OTC NSAIDs such as IBU or aleve? Has she ever had a rash with these? Or meloxicam? Is she still having pain as she was doing better yesterday? If better no need to continue. If she is still having pain we can try one of these. If she has rash with the OTC medications or meloxicam we can try a prednisone taper.

## 2016-12-02 NOTE — Telephone Encounter (Signed)
This is a patient of Dennis Chrismon's.  She was seen yesterday.  The patient had been taking Etodolac and had broke out I a rash.  She was instructed to discontinue it, which she did.  Yesterday she was told to go ahead and get back on it to see if she broke out again and this morning she calls to say that she has new rash since being back on the medication.  I have instructed her not to take any more until we call her back and to take an antihistamine if the symptoms bothered her. Please call patient with your advise.

## 2016-12-15 ENCOUNTER — Ambulatory Visit (INDEPENDENT_AMBULATORY_CARE_PROVIDER_SITE_OTHER): Payer: 59 | Admitting: Family Medicine

## 2016-12-15 ENCOUNTER — Encounter: Payer: Self-pay | Admitting: Family Medicine

## 2016-12-15 ENCOUNTER — Telehealth: Payer: Self-pay

## 2016-12-15 ENCOUNTER — Ambulatory Visit
Admission: RE | Admit: 2016-12-15 | Discharge: 2016-12-15 | Disposition: A | Discharge status: Home / Self Care | Payer: 59 | Source: Ambulatory Visit | Attending: Family Medicine | Admitting: Family Medicine

## 2016-12-15 VITALS — BP 106/82 | HR 78 | Temp 98.4°F | Wt 207.0 lb

## 2016-12-15 DIAGNOSIS — R091 Pleurisy: Secondary | ICD-10-CM

## 2016-12-15 DIAGNOSIS — F411 Generalized anxiety disorder: Secondary | ICD-10-CM

## 2016-12-15 NOTE — Telephone Encounter (Signed)
-----   Message from Margo Common, [place] sent at 12/15/2016 10:15 AM EDT ----- Normal chest x-ray. No masses or signs of infection.

## 2016-12-15 NOTE — Telephone Encounter (Signed)
Patient advised.

## 2016-12-15 NOTE — Progress Notes (Signed)
Patient: Gloria Li Female    DOB: 06-02-1976   41 y.o.   MRN: [id number] Visit Date: 12/15/2016  Today's Provider: Vernie Murders, PA   Chief Complaint  Patient presents with  . Depression  . Anxiety  . Follow-up   Subjective:    HPI Depression & Anxiety Follow Up:  Patient was last seen for this 3 months ago. She was advised to continue Sertraline 50 mg qd.              Patient reports excellent compliance with treatment. She is not having side effects.   She reports excellent tolerance of treatment. Current symptoms include: none Patient reports symptoms have improved with treatment.  Patient Active Problem List   Diagnosis Date Noted  . History of mastectomy, bilateral 12/01/2016  . Hypothyroidism 02/22/2014  . Obesity 02/22/2014  . Breast cancer screening, high risk patient 11/03/2012  . Family history of breast cancer in mother 09/21/2012  . Family history of breast cancer in first degree relative 08/05/2012   Past Surgical History:  Procedure Laterality Date  . BREAST SURGERY Bilateral 10/2012   Mother had breast cancer masectomy was for prophylactically. later in 2014 she had breast implants.   . CESAREAN SECTION  2002   Family History  Problem Relation Age of Onset  . Cancer Mother   . Glaucoma Father    Allergies  Allergen Reactions  . Etodolac Hives  . Penicillins Rash    As an infant  . Sulfa Antibiotics Rash     Previous Medications   CHOLECALCIFEROL (VITAMIN D3) 1000 UNITS CAPS    Take by mouth daily.   CYANOCOBALAMIN (VITAMIN B 12 PO)    Take 1 tablet by mouth daily.    CYCLOBENZAPRINE (FLEXERIL) 5 MG TABLET    Take by mouth.   ETODOLAC (LODINE) 500 MG TABLET    Take by mouth.   LEVOTHYROXINE (SYNTHROID, LEVOTHROID) 100 MCG TABLET    Take by mouth.   SERTRALINE (ZOLOFT) 50 MG TABLET    Take 1 tablet (50 mg total) by mouth daily.    Review of Systems  Constitutional: Negative.   Respiratory: Negative.   Cardiovascular:  Negative.     Social History  Substance Use Topics  . Smoking status: Never Smoker  . Smokeless tobacco: Never Used  . Alcohol use No   Objective:   BP 106/82 (BP Location: Right Arm, Patient Position: Sitting, Cuff Size: Normal)   Pulse 78   Temp 98.4 F (36.9 C) (Oral)   Wt 207 lb (93.9 kg)   SpO2 98%   BMI 36.67 kg/m   Physical Exam  Constitutional: She is oriented to person, place, and time. She appears well-developed and well-nourished. No distress.  HENT:  Head: Normocephalic and atraumatic.  Right Ear: Hearing normal.  Left Ear: Hearing normal.  Nose: Nose normal.  Eyes: Conjunctivae and lids are normal. Right eye exhibits no discharge. Left eye exhibits no discharge. No scleral icterus.  Neck: Neck supple.  Cardiovascular: Normal rate and regular rhythm.   Pulmonary/Chest: Effort normal and breath sounds normal. No respiratory distress.  Abdominal: Soft.  Musculoskeletal: Normal range of motion.  Neurological: She is alert and oriented to person, place, and time.  Skin: Skin is intact. No lesion and no rash noted.  Psychiatric: She has a normal mood and affect. Her speech is normal and behavior is normal. Thought content normal.      Assessment & Plan:     1. Pleurisy No further  pain, cough or wheeze. Brought CD of the CXR done at [facility] from 11-25-16 that showed a left lung base streaky density. Will recheck x-ray to see if this was some inflammation that has cleared. Was initially very panicky due to fear of cancer (mother died of breast CA and this patient had prophylactic bilateral mastectomies). Recheck pending repeat CXR and comparison with previous films by radiologist. - DG Chest 2 View  2. Generalized anxiety disorder Improved with use of the Zoloft 50 mg qd. Sleeping well and no further crying spells. Depressive reaction settled and no return of panic sensation since chest discomfort has stopped. Continue this dosage and recheck pending x-ray report.

## 2017-04-17 DIAGNOSIS — Z1371 Encounter for nonprocreative screening for genetic disease carrier status: Secondary | ICD-10-CM

## 2017-04-17 DIAGNOSIS — Z9189 Other specified personal risk factors, not elsewhere classified: Secondary | ICD-10-CM

## 2017-04-17 HISTORY — DX: Encounter for nonprocreative screening for genetic disease carrier status: Z13.71

## 2017-04-17 HISTORY — DX: Other specified personal risk factors, not elsewhere classified: Z91.89

## 2017-04-20 ENCOUNTER — Encounter: Payer: Self-pay | Admitting: Family Medicine

## 2017-04-23 ENCOUNTER — Ambulatory Visit (INDEPENDENT_AMBULATORY_CARE_PROVIDER_SITE_OTHER): Payer: 59 | Admitting: Certified Nurse Midwife

## 2017-04-23 ENCOUNTER — Encounter: Payer: Self-pay | Admitting: Certified Nurse Midwife

## 2017-04-23 VITALS — BP 112/72 | HR 74 | Ht 63.0 in | Wt 209.0 lb

## 2017-04-23 DIAGNOSIS — Z01419 Encounter for gynecological examination (general) (routine) without abnormal findings: Secondary | ICD-10-CM | POA: Diagnosis not present

## 2017-04-23 DIAGNOSIS — Z1211 Encounter for screening for malignant neoplasm of colon: Secondary | ICD-10-CM

## 2017-04-23 DIAGNOSIS — Z803 Family history of malignant neoplasm of breast: Secondary | ICD-10-CM | POA: Diagnosis not present

## 2017-04-23 DIAGNOSIS — Z124 Encounter for screening for malignant neoplasm of cervix: Secondary | ICD-10-CM

## 2017-04-23 DIAGNOSIS — Z800 Family history of malignant neoplasm of digestive organs: Secondary | ICD-10-CM | POA: Diagnosis not present

## 2017-04-23 NOTE — Progress Notes (Signed)
Gynecology Annual Exam  PCP: Margo Common, [place]  Chief Complaint:  Chief Complaint  Patient presents with  . Gynecologic Exam    History of Present Illness:Gloria Li is a 41 year old Caucasian/White female, G4 P28, who presents for her annual exam. She is having no significant GYN problems.  Her menses are absent and she is postmenopausal. She does have vaginal dryness and she is using lubricants when needed.  She has had no spotting. Denies hot flashes  The patient's past medical history is notable for a history of premature ovarian failure, Hashimotos, and prophylactic subcutaneous mastectomies with implants for increased risk of breast cancer..  Since her last annual GYN exam dated 12/19/2015, she has been started on Zoloft for anxiety surrounding taking a new position at work.  She was also treated for pleurisy earlier this year. She is sexually active. She is currently using a vasectomy and and she is postmenopausal for contraception.  Her most recent pap smear was obtained 12/19/2015 and was NIL.  Mammogram is not applicable.  There is a positive history of breast cancer in her mother. Genetic testing has been done. The patient tested negative for BRCA 1, negative for BRCA 2, and negative for BART. She is interested in [facility] update testing There is no family history of ovarian cancer.  The patient does do monthly self breast exams.  The patient does not smoke.  The patient does drink rarely.  The patient does not use illegal drugs.  The patient exercises by walking daily.  The patient does get adequate calcium in her diet.  She had a recent cholesterol screen in 2018 that was normal.        Review of Systems: Review of Systems  Constitutional: Negative for chills, fever and weight loss.  HENT: Negative for congestion, sinus pain and sore throat.   Eyes: Negative for blurred vision and pain.  Respiratory: Negative for hemoptysis, shortness of breath and  wheezing.   Cardiovascular: Negative for chest pain, palpitations and leg swelling.  Gastrointestinal: Negative for abdominal pain, blood in stool, diarrhea, heartburn, nausea and vomiting.  Genitourinary: Negative for dysuria, frequency, hematuria and urgency.  Musculoskeletal: Negative for back pain, joint pain and myalgias.  Skin: Negative for itching and rash.  Neurological: Negative for dizziness, tingling and headaches.  Endo/Heme/Allergies: Negative for environmental allergies and polydipsia. Does not bruise/bleed easily.       Negative for hirsutism   Psychiatric/Behavioral: Negative for depression. The patient is not nervous/anxious and does not have insomnia.     Past Medical History:  Past Medical History:  Diagnosis Date  . Anxiety   . Bursitis   . Genetic testing of female    BRCA negative  . Premature ovarian failure   . Thyroid disease     Past Surgical History:  Past Surgical History:  Procedure Laterality Date  . BREAST SURGERY Bilateral 10/2012   Mother had breast cancer masectomy was for prophylactically. later in 2014 she had breast implants.   . CESAREAN SECTION  2002  . WISDOM TOOTH EXTRACTION      Family History:  Family History  Problem Relation Age of Onset  . Bone cancer Mother 70  . Breast cancer Mother 85       second dx age 34  . Lung cancer Mother 78  . Glaucoma Father   . Colon cancer Cousin 64    Social History:  Social History   Social History  . Marital status:  Married    Spouse name: N/A  . Number of children: 1  . Years of education: N/A   Occupational History  . Team leader-third [employer]    Social History Main Topics  . Smoking status: Never Smoker  . Smokeless tobacco: Never Used  . Alcohol use Yes     Comment: rarely  . Drug use: No  . Sexual activity: Yes    Partners: Male    Birth control/ protection: Post-menopausal, Other-see comments     Comment: vasectomy   Other Topics Concern  . Not on file   Social  History Narrative  . No narrative on file    Allergies:  Allergies  Allergen Reactions  . Etodolac Hives  . Penicillins Rash    As an infant  . Sulfa Antibiotics Rash    Medications: Prior to Admission medications   Medication Sig Start Date End Date Taking? Authorizing Provider  Cholecalciferol (VITAMIN D3) 1000 units CAPS Take by mouth daily.    [provider]  Cyanocobalamin (VITAMIN B 12 PO) Take 1 tablet by mouth daily.     [provider]  cyclobenzaprine (FLEXERIL) 5 MG tablet Take by mouth. 11/25/16   [provider]         levothyroxine (SYNTHROID, LEVOTHROID) 100 MCG tablet Take by mouth. 01/01/16   [provider]  sertraline (ZOLOFT) 50 MG tablet Take 1 tablet (50 mg total) by mouth daily. 10/30/16   Chrismon, Vickki Muff, PA    Physical Exam Vitals: BP 112/72   Pulse 74   Ht _0  (1.6 m)   Wt 209 lb (94.8 kg)   BMI 37.02 kg/m   General: pleasant WF in NAD HEENT: normocephalic, anicteric Neck: no thyroid enlargement, no palpable nodules, no cervical lymphadenopathy  Pulmonary: No increased work of breathing, CTAB Cardiovascular: RRR, without murmur  Breast: s/p bilateral mastectomies with implants, no nipples, no masses, no inflammation, NT. Scars present from surgery. No axillary, infraclavicular or supraclavicular lymphadenopathy. Abdomen: Soft, non-tender, non-distended.  Umbilicus without lesions.  No hepatomegaly or masses palpable. No evidence of hernia. Genitourinary:  External: Normal external female genitalia.  Normal urethral meatus, normal Bartholin's and Skene's glands.    Vagina: Normal vaginal mucosa, no evidence of prolapse.    Cervix: Grossly normal in appearance, no bleeding, non-tender  Uterus: Anteverted, normal size, shape, and consistency, mobile, and non-tender  Adnexa: No adnexal masses, non-tender  Rectal: deferred  Lymphatic: no evidence of inguinal lymphadenopathy Extremities: no edema, erythema, or  tenderness Neurologic: Grossly intact Psychiatric: mood appropriate, affect full     Assessment: 41 y.o. annual gyn exam S/P subcutaneous mastectomies for increased risk of breast cancer  Family history of breast and colon cancer  Plan:  1) Breast cancer screening - recommend monthly self breast exam. Mammograms not needed s/p mastectomies. Desires MYR[facility]K update testing. Blood drawn and sent to [facility].  2) Colon cancer screening-FIT test thru [facility]. Collection kit given  3) Cervical cancer screening - Pap was done. ASCCP guidelines and rational discussed.  Patient opts for yearly screening interval  4) Contraception - not indicated due to being postmenopausal  5) Routine healthcare maintenance including cholesterol and diabetes screening UTD   6) Osteoporosis prevention_ getting adequate calcium and vitamin D3. Discussed increasing exercise for osteoporosis prevention and in helping with weight loss. DEXA scan next year  7) RTO I year.   Dalia Heading, CNM

## 2017-04-26 LAB — IGP,RFX APTIMA HPV ALL PTH: PAP SMEAR COMMENT: 0

## 2017-05-06 ENCOUNTER — Encounter: Payer: Self-pay | Admitting: Obstetrics and Gynecology

## 2017-06-30 ENCOUNTER — Telehealth: Payer: Self-pay

## 2017-06-30 NOTE — Telephone Encounter (Signed)
This is Colleen's pt. See if she has results for pt because she usually likes to call them.

## 2017-06-30 NOTE — Telephone Encounter (Signed)
Pt called today for [facility] results. Copy of results scanned into media file. Pt aware results negative but would like to have a copy of the results sent to her. Not sure if you had a patient packet or if they needed to be printed from chart. Thank you!

## 2017-06-30 NOTE — Telephone Encounter (Signed)
I do not have a hard copy, but there is a sticky note on her papers stating we are waiting for hard copy. Alicia please advise.

## 2017-06-30 NOTE — Telephone Encounter (Signed)
Please advise 

## 2017-07-01 NOTE — Telephone Encounter (Signed)
I called Latesa and am sending her a hard copy of her results. [employer]

## 2017-09-11 ENCOUNTER — Other Ambulatory Visit: Payer: Self-pay | Admitting: Family Medicine

## 2017-09-11 DIAGNOSIS — F4323 Adjustment disorder with mixed anxiety and depressed mood: Secondary | ICD-10-CM

## 2017-11-25 ENCOUNTER — Telehealth: Payer: Self-pay | Admitting: Family Medicine

## 2017-11-25 NOTE — Telephone Encounter (Signed)
Patient advised. She verbalized understanding. She would like the patches sent to [facility] at [facility].

## 2017-11-25 NOTE — Telephone Encounter (Signed)
Should use Flonase Nasal Spray 2 sprays each nostril at bedtime or Claritin (loratadine) 10 mg qd for allergies (both over-the-counter). Should schedule check up if no better with these medications to rule out infection. Should be aware that Scopolamine patches can cause significant side effects (sleepiness, dry mouth, urinary retention, irritable mood, nausea, etc.) if used too long. Which [facility] on [address] (at [employer] [address]

## 2017-11-25 NOTE — Telephone Encounter (Signed)
Pt called in with a very hoarse voice asking for an allergy pill to be called in.  Also needs scopolamine patches for a cruise next week called to [place] on [address].

## 2017-11-26 ENCOUNTER — Other Ambulatory Visit: Payer: Self-pay | Admitting: Family Medicine

## 2017-11-26 MED ORDER — SCOPOLAMINE 1 MG/3DAYS TD PT72: 1.0000 | MEDICATED_PATCH | TRANSDERMAL | 0 refills | Status: DC

## 2017-11-26 NOTE — Telephone Encounter (Signed)
Sent to above pharmacy for seasickness.

## 2018-03-07 LAB — MEASLES/MUMPS/RUBELLA IMMUNITY
MUMPS ABS, IGG: 31.5
RUBELLA: 2.29
RUBEOLA AB, IGG: 45.9

## 2018-03-07 LAB — HDL CHOLESTEROL
HDL Cholesterol: 41
TRIGLYCERIDES: 128

## 2018-03-07 LAB — HEMOGLOBIN A1C: A1C: 5.6

## 2018-03-07 LAB — CREATININE: Creat: 1.03

## 2018-03-07 LAB — TRIGLYCERIDES: TRIGLYCERIDES: 128

## 2018-03-07 LAB — CHOLESTEROL, TOTAL: Cholesterol, Total: 175

## 2018-03-07 LAB — LDL CHOLESTEROL, DIRECT: LDL Cholesterol: 108

## 2018-03-22 ENCOUNTER — Encounter: Payer: Self-pay | Admitting: Internal Medicine

## 2018-03-22 ENCOUNTER — Ambulatory Visit (INDEPENDENT_AMBULATORY_CARE_PROVIDER_SITE_OTHER): Payer: BLUE CROSS/BLUE SHIELD | Admitting: Internal Medicine

## 2018-03-22 VITALS — BP 118/80 | HR 92 | Temp 97.9°F | Ht 63.5 in | Wt 218.0 lb

## 2018-03-22 DIAGNOSIS — E039 Hypothyroidism, unspecified: Secondary | ICD-10-CM | POA: Diagnosis not present

## 2018-03-22 DIAGNOSIS — F411 Generalized anxiety disorder: Secondary | ICD-10-CM

## 2018-03-22 DIAGNOSIS — J301 Allergic rhinitis due to pollen: Secondary | ICD-10-CM

## 2018-03-22 DIAGNOSIS — R202 Paresthesia of skin: Secondary | ICD-10-CM | POA: Diagnosis not present

## 2018-03-22 NOTE — Progress Notes (Signed)
HPI  Pt presents to the clinic today to establish care and for management of the conditions listed below. She is transferring care from [facility].  Anxiety: Triggered by her mother cancer diagnosis and death. She is taking Sertraline and reports some relief with this. She has taken Paxil in the past. She has also seen a therapist in the past. She denies depression, SI/HI.  Hypothyroidism: She is due to have her levels repeated in December. She is taking her Levothyroxine as prescribed.  She also reports runny nose, nasal congestion and post nasal drip. She has been taking Claritin which did not seem effective. She switched to Zyrtec seems to make her too drowsy. She wants to know what she may take OTC that is better than Claritin but not as drowsy as Zyrtec.  She also reports tingling in bilateral hands and feet. She saw an orthopedist for the same and was told it is not carpal tunnell. Her Vit B12 was normal but she is taking a supplement. She is not sure if Vit D was checked. She does not have diabetes. She sees a neurologist September 6th for further evaluation.  Flu: never Tetanus: > 10 years ago Pap Smear: 04/2017 Mammogram: double mastectomy, does not get yearly mammograms Vision Screening: annually Dentist: biannually  Past Medical History:  Diagnosis Date  . Anxiety   . BRCA negative 04/2017   MyRisk neg  . Bursitis   . Family history of breast cancer   . Genetic testing of female    BRCA negative  . Increased risk of breast cancer 04/2017   IBIS=23.65/riskscore=22%  . Premature ovarian failure   . Thyroid disease     Current Outpatient Medications  Medication Sig Dispense Refill  . Cholecalciferol (VITAMIN D3) 1000 units CAPS Take by mouth daily.    . Cyanocobalamin (VITAMIN B 12 PO) Take 1 tablet by mouth daily.     Marland Kitchen levothyroxine (SYNTHROID, LEVOTHROID) 100 MCG tablet Take by mouth.    Marland Kitchen scopolamine (TRANSDERM-SCOP) 1 MG/3DAYS Place 1 patch (1.5 mg  total) onto the skin every 3 (three) days. Apply to hairless skin behind ear for seasickness. 4 patch 0  . sertraline (ZOLOFT) 50 MG tablet TAKE 1 TABLET BY MOUTH  DAILY 90 tablet 3   No current facility-administered medications for this visit.     Allergies  Allergen Reactions  . Etodolac Hives  . Penicillins Rash    As an infant  . Sulfa Antibiotics Rash    Family History  Problem Relation Age of Onset  . Bone cancer Mother 37  . Breast cancer Mother 33       second dx age 44  . Lung cancer Mother 74  . Glaucoma Father   . Colon cancer Cousin 49    Social History   Socioeconomic History  . Marital status: Married    Spouse name: Not on file  . Number of children: 1  . Years of education: Not on file  . Highest education level: Not on file  Occupational History  . Occupation: Team leader-third party  Social Needs  . Financial resource strain: Not on file  . Food insecurity:    Worry: Not on file    Inability: Not on file  . Transportation needs:    Medical: Not on file    Non-medical: Not on file  Tobacco Use  . Smoking status: Never Smoker  . Smokeless tobacco: Never Used  Substance and Sexual Activity  . Alcohol use: Yes  Comment: rarely  . Drug use: No  . Sexual activity: Yes    Partners: Male    Birth control/protection: Post-menopausal, Other-see comments    Comment: vasectomy  Lifestyle  . Physical activity:    Days per week: Not on file    Minutes per session: Not on file  . Stress: Not on file  Relationships  . Social connections:    Talks on phone: Not on file    Gets together: Not on file    Attends religious service: Not on file    Active member of club or organization: Not on file    Attends meetings of clubs or organizations: Not on file    Relationship status: Not on file  . Intimate partner violence:    Fear of current or ex partner: Not on file    Emotionally abused: Not on file    Physically abused: Not on file    Forced sexual  activity: Not on file  Other Topics Concern  . Not on file  Social History Narrative  . Not on file    ROS:  Constitutional: Denies fever, malaise, fatigue, headache or abrupt weight changes.  HEENT: Pt reports runny nose, nasal congestion. Denies eye pain, eye redness, ear pain, ringing in the ears, wax buildup, bloody nose, or sore throat. Respiratory: Denies difficulty breathing, shortness of breath, cough or sputum production.   Cardiovascular: Denies chest pain, chest tightness, palpitations or swelling in the hands or feet.  Gastrointestinal: Denies abdominal pain, bloating, constipation, diarrhea or blood in the stool.  GU: Denies frequency, urgency, pain with urination, blood in urine, odor or discharge. Musculoskeletal: Denies decrease in range of motion, difficulty with gait, muscle pain or joint pain and swelling.  Skin: Denies redness, rashes, lesions or ulcercations.  Neurological: Pt reports numbness and tingling in hands and feet. Denies dizziness, difficulty with memory, difficulty with speech or problems with balance and coordination.  Psych: Pt reports anxiety. Denies depression, SI/HI.  No other specific complaints in a complete review of systems (except as listed in HPI above).  PE: BP 118/80   Pulse 92   Temp 97.9 F (36.6 C) (Oral)   Ht 5' 3.5" (1.613 m)   Wt 218 lb (98.9 kg)   SpO2 98%   BMI 38.01 kg/m   Wt Readings from Last 3 Encounters:  04/23/17 209 lb (94.8 kg)  12/15/16 207 lb (93.9 kg)  12/01/16 208 lb (94.3 kg)    General: Appears her stated age, well developed, well nourished in NAD. HEENT: Head: normal shape and size, no sinus tenderness noted; Throat/Mouth: Teeth present, mucosa pink and moist, + PND, no lesions or ulcerations noted.  Neck: Neck supple, trachea midline. No masses, lumps or thyromegaly present.  Cardiovascular: Normal rate and rhythm. S1,S2 noted.  No murmur, rubs or gallops noted.  Pulmonary/Chest: Normal effort and  positive vesicular breath sounds. No respiratory distress. No wheezes, rales or ronchi noted.  Musculoskeletal: No difficulty with gait.  Neurological: Alert and oriented. Coordination normal.  Psychiatric: Mood and affect normal. Behavior is normal. Judgment and thought content normal.    Assessment and Plan:  Allergic Rhinitis:  Try Allegra OTC  Paresthesia of BUE/BLE:  She has an appt scheduled with neurology for further evaluation  Make an appt for your annual exam Webb Silversmith, NP

## 2018-03-23 DIAGNOSIS — F411 Generalized anxiety disorder: Secondary | ICD-10-CM | POA: Insufficient documentation

## 2018-03-23 NOTE — Assessment & Plan Note (Signed)
Will check TSH and Free T4 yearly Continue Levothyroxine for now

## 2018-03-23 NOTE — Patient Instructions (Signed)
Paresthesia Paresthesia is a burning or prickling feeling. This feeling can happen in any part of the body. It often happens in the hands, arms, legs, or feet. Usually, it is not painful. In most cases, the feeling goes away in a short time and is not a sign of a serious problem. Follow these instructions at home:  Avoid drinking alcohol.  Try massage or needle therapy (acupuncture) to help with your problems.  Keep all follow-up visits as told by your doctor. This is important. Contact a doctor if:  You keep on having episodes of paresthesia.  Your burning or prickling feeling gets worse when you walk.  You have pain or cramps.  You feel dizzy.  You have a rash. Get help right away if:  You feel weak.  You have trouble walking or moving.  You have problems speaking, understanding, or seeing.  You feel confused.  You cannot control when you pee (urinate) or poop (bowel movement).  You lose feeling (numbness) after an injury.  You pass out (faint). This information is not intended to replace advice given to you by your health care provider. Make sure you discuss any questions you have with your health care provider. Document Released: 07/16/2008 Document Revised: 01/09/2016 Document Reviewed: 07/30/2014 Elsevier Interactive Patient Education  2018 Elsevier Inc.  

## 2018-03-23 NOTE — Assessment & Plan Note (Signed)
Controlled on Sertraline Will monitor

## 2018-05-06 ENCOUNTER — Encounter: Payer: Self-pay | Admitting: Internal Medicine

## 2018-05-06 ENCOUNTER — Ambulatory Visit: Payer: BLUE CROSS/BLUE SHIELD | Admitting: Internal Medicine

## 2018-05-06 VITALS — BP 124/78 | HR 82 | Temp 98.6°F | Ht 63.5 in | Wt 214.8 lb

## 2018-05-06 DIAGNOSIS — R197 Diarrhea, unspecified: Secondary | ICD-10-CM

## 2018-05-06 DIAGNOSIS — Z800 Family history of malignant neoplasm of digestive organs: Secondary | ICD-10-CM | POA: Diagnosis not present

## 2018-05-06 DIAGNOSIS — R1032 Left lower quadrant pain: Secondary | ICD-10-CM

## 2018-05-07 ENCOUNTER — Encounter: Payer: Self-pay | Admitting: Internal Medicine

## 2018-05-07 LAB — T4, FREE: FREE T4: 1.38 ng/dL (ref 0.82–1.77)

## 2018-05-07 LAB — TSH: TSH: 2.01 u[IU]/mL (ref 0.450–4.500)

## 2018-05-07 NOTE — Patient Instructions (Signed)

## 2018-05-07 NOTE — Progress Notes (Signed)
Subjective:    Patient ID: Gloria Li, female    DOB: 03-25-1976, 42 y.o.   MRN: [id number]  HPI  Pt presents to the clinic today to follow up LLQ pain and loose stools. She reports she was seen at [facility] 8/14 for the same. WBC was normal. She was treated with Cipro, Flagyl and Phenergan for a presumed diverticulitis. She has not had any abdominal imaging or previous colonoscopy that has shown that she has diverticulosis. She reports after taking the medication, she felt better. She intermittently has some left lower quadrant pain and loose stools. She denies blood in her stool. She would like a referral to GI for a colonoscopy. She reports she has a 1st cousin with a history of colon cancer.  Review of Systems      Past Medical History:  Diagnosis Date  . Anxiety   . BRCA negative 04/2017   MyRisk neg  . Bursitis   . Family history of breast cancer   . Genetic testing of female    BRCA negative  . Increased risk of breast cancer 04/2017   IBIS=23.65/riskscore=22%  . Premature ovarian failure   . Thyroid disease     Current Outpatient Medications  Medication Sig Dispense Refill  . Cholecalciferol (VITAMIN D3) 1000 units CAPS Take by mouth daily.    Marland Kitchen levothyroxine (SYNTHROID, LEVOTHROID) 100 MCG tablet Take by mouth.    . sertraline (ZOLOFT) 50 MG tablet TAKE 1 TABLET BY MOUTH  DAILY 90 tablet 3   No current facility-administered medications for this visit.     Allergies  Allergen Reactions  . Penicillins Rash    As an infant  . Sulfa Antibiotics Rash    Family History  Problem Relation Age of Onset  . Bone cancer Mother 75  . Breast cancer Mother 57       second dx age 60  . Lung cancer Mother 31  . Glaucoma Father   . Colon cancer Cousin 39  . Hypertension Maternal Grandmother   . Heart failure Maternal Grandfather     Social History   Socioeconomic History  . Marital status: Married    Spouse name: Not on file  . Number of children:  1  . Years of education: Not on file  . Highest education level: Not on file  Occupational History  . Occupation: Team leader-third party  Social Needs  . Financial resource strain: Not on file  . Food insecurity:    Worry: Not on file    Inability: Not on file  . Transportation needs:    Medical: Not on file    Non-medical: Not on file  Tobacco Use  . Smoking status: Never Smoker  . Smokeless tobacco: Never Used  Substance and Sexual Activity  . Alcohol use: Never    Frequency: Never  . Drug use: No  . Sexual activity: Yes    Partners: Male    Birth control/protection: Post-menopausal, Other-see comments    Comment: vasectomy  Lifestyle  . Physical activity:    Days per week: Not on file    Minutes per session: Not on file  . Stress: Not on file  Relationships  . Social connections:    Talks on phone: Not on file    Gets together: Not on file    Attends religious service: Not on file    Active member of club or organization: Not on file    Attends meetings of clubs or organizations: Not on file  Relationship status: Not on file  . Intimate partner violence:    Fear of current or ex partner: Not on file    Emotionally abused: Not on file    Physically abused: Not on file    Forced sexual activity: Not on file  Other Topics Concern  . Not on file  Social History Narrative  . Not on file     Constitutional: Denies fever, malaise, fatigue, headache or abrupt weight changes.  Respiratory: Denies difficulty breathing, shortness of breath, cough or sputum production.   Cardiovascular: Denies chest pain, chest tightness, palpitations or swelling in the hands or feet.  Gastrointestinal: Pt reports LLQ abdominal pain, loose stools. Denies constipation, or blood in the stool.  GU: Denies urgency, frequency, pain with urination, burning sensation, blood in urine, odor or discharge.   No other specific complaints in a complete review of systems (except as listed in HPI  above).  Objective:   Physical Exam  BP 124/78 (BP Location: Left Arm, Patient Position: Sitting, Cuff Size: Large)   Pulse 82   Temp 98.6 F (37 C) (Oral)   Ht 5' 3.5" (1.613 m)   Wt 214 lb 12 oz (97.4 kg)   SpO2 98%   BMI 37.44 kg/m  Wt Readings from Last 3 Encounters:  05/06/18 214 lb 12 oz (97.4 kg)  03/22/18 218 lb (98.9 kg)  04/23/17 209 lb (94.8 kg)    General: Appears her stated age, obese in NAD. Cardiovascular: Normal rate and rhythm. S1,S2 noted.  No murmur, rubs or gallops noted.  Pulmonary/Chest: Normal effort and positive vesicular breath sounds. No respiratory distress. No wheezes, rales or ronchi noted.  Abdomen: Soft and nontender. Normal bowel sounds. No distention or masses noted.    BMET    Component Value Date/Time   CREATININE 1.03 03/07/2018    Lipid Panel     Component Value Date/Time   CHOL 175 03/07/2018   TRIG 128 03/07/2018   TRIG 128 03/07/2018   HDL 41 03/07/2018   LDLCALC 108 03/07/2018    CBC    Component Value Date/Time   WBC 9.0 12/01/2016 1131   RBC 5.08 12/01/2016 1131   HGB 14.7 12/01/2016 1131   HCT 44.5 12/01/2016 1131   PLT 271 12/01/2016 1131   MCV 88 12/01/2016 1131   MCH 28.9 12/01/2016 1131   MCHC 33.0 12/01/2016 1131   RDW 13.8 12/01/2016 1131   LYMPHSABS 1.8 12/01/2016 1131   EOSABS 0.1 12/01/2016 1131   BASOSABS 0.0 12/01/2016 1131    Hgb A1C No results found for: HGBA1C          Assessment & Plan:   LLQ Abdominal Pain, Loose Stools, Family History of Colon Cancer:  Acute issues have resolved No indication for repeat abx at this time Will check TSH, Free T4 just to make sure that she is not hyperthyroid Referral placed to GI for further evaluation  Return precautions discussed Webb Silversmith, NP

## 2018-06-16 ENCOUNTER — Encounter: Payer: Self-pay | Admitting: Gastroenterology

## 2018-06-16 ENCOUNTER — Ambulatory Visit (INDEPENDENT_AMBULATORY_CARE_PROVIDER_SITE_OTHER): Payer: BLUE CROSS/BLUE SHIELD | Admitting: Gastroenterology

## 2018-06-16 VITALS — BP 111/76 | HR 83 | Ht 63.5 in | Wt 217.6 lb

## 2018-06-16 DIAGNOSIS — K5732 Diverticulitis of large intestine without perforation or abscess without bleeding: Secondary | ICD-10-CM

## 2018-06-16 NOTE — Progress Notes (Signed)
Jonathon Bellows MD, MRCP(U.K) [address]  [place]  [place], [place] [address]  Main: [phone]  Fax: [phone]   Gastroenterology Consultation  Referring Provider:     Jearld Fenton, NP Primary Care Physician:  Jearld Fenton, NP Primary Gastroenterologist:  Dr. Jonathon Bellows  Reason for Consultation:     Diarrhea         HPI:   Gloria Li is a 42 y.o. y/o female referred for consultation & management  by Dr. Garnette Gunner, Coralie Keens, NP.   She has been referred for diarrhea. [facility] on 03/30/18 and given course of antibiotics.    She says that in August had some lower left abdominal pain . Was seen by urgent care and was treated for diverticulitis. So far no abdominal pain and the antibiotics worked. She says she is concerned as her cousin had colon cancer. Her mother had cancers x2 of bone, lung and pancreas.   She has never had a colonoscopy. No rectal bleeding. Presently no diarrhea. Has resolved. Stool has consistency of being a bit soft and loose. She has 1 bowel movement - not diarrhea. Denies any weight loss, not on any blood thinners. Some change in the shape of the stool.   Past Medical History:  Diagnosis Date  . Anxiety   . BRCA negative 04/2017   MyRisk neg  . Bursitis   . Family history of breast cancer   . Genetic testing of female    BRCA negative  . Increased risk of breast cancer 04/2017   IBIS=23.65/riskscore=22%  . Premature ovarian failure   . Thyroid disease     Past Surgical History:  Procedure Laterality Date  . BREAST SURGERY Bilateral 10/2012   Mother had breast cancer masectomy was for prophylactically. later in 2014 she had breast implants.   . CESAREAN SECTION  2002  . WISDOM TOOTH EXTRACTION      Prior to Admission medications   Medication Sig Start Date End Date Taking? Authorizing Provider  Cholecalciferol (VITAMIN D3) 1000 units CAPS Take by mouth daily.    [provider]  levothyroxine  (SYNTHROID, LEVOTHROID) 100 MCG tablet Take by mouth. 01/01/16   [provider]  sertraline (ZOLOFT) 50 MG tablet TAKE 1 TABLET BY MOUTH  DAILY 09/12/17   Chrismon, Vickki Muff, PA    Family History  Problem Relation Age of Onset  . Bone cancer Mother 43  . Breast cancer Mother 67       second dx age 56  . Lung cancer Mother 72  . Glaucoma Father   . Colon cancer Cousin 57  . Hypertension Maternal Grandmother   . Heart failure Maternal Grandfather      Social History   Tobacco Use  . Smoking status: Never Smoker  . Smokeless tobacco: Never Used  Substance Use Topics  . Alcohol use: Never    Frequency: Never  . Drug use: No    Allergies as of 06/16/2018 - Review Complete 05/07/2018  Allergen Reaction Noted  . Penicillins Rash 06/22/2012  . Sulfa antibiotics Rash 06/22/2012    Review of Systems:    All systems reviewed and negative except where noted in HPI.   Physical Exam:  There were no vitals taken for this visit. No LMP recorded. Patient is postmenopausal. Psych:  Alert and cooperative. Normal mood and affect. General:   Alert,  Well-developed, well-nourished, pleasant and cooperative in NAD Head:  Normocephalic and atraumatic. Eyes:  Sclera clear,  no icterus.   Conjunctiva pink. Ears:  Normal auditory acuity. Nose:  No deformity, discharge, or lesions. Mouth:  No deformity or lesions,oropharynx pink & moist. Neck:  Supple; no masses or thyromegaly. Lungs:  Respirations even and unlabored.  Clear throughout to auscultation.   No wheezes, crackles, or rhonchi. No acute distress. Heart:  Regular rate and rhythm; no murmurs, clicks, rubs, or gallops. Abdomen:  Normal bowel sounds.  No bruits.  Soft, non-tender and non-distended without masses, hepatosplenomegaly or hernias noted.  No guarding or rebound tenderness.    Neurologic:  Alert and oriented x3;  grossly normal neurologically. Skin:  Intact without significant lesions or rashes. No jaundice. Lymph  Nodes:  No significant cervical adenopathy. Psych:  Alert and cooperative. Normal mood and affect.  Imaging Studies: No results found.  Assessment and Plan:   Gloria Li is a 42 y.o. y/o female has been referred for diarrhea . No diarrhea presently, had an episode of possible sigmoidi diverticulitis in 03/2018 . No other red flag signs , some change in the shape of her stool. No abdominal pain presently.   Plan  1 . Colonoscopy   I have discussed alternative options, risks & benefits,  which include, but are not limited to, bleeding, infection, perforation,respiratory complication & drug reaction.  The patient agrees with this plan & written consent will be obtained.     Dr Jonathon Bellows MD,MRCP(U.K)

## 2018-07-19 ENCOUNTER — Other Ambulatory Visit: Payer: Self-pay | Admitting: Family Medicine

## 2018-07-19 DIAGNOSIS — F4323 Adjustment disorder with mixed anxiety and depressed mood: Secondary | ICD-10-CM

## 2018-07-26 ENCOUNTER — Ambulatory Visit (INDEPENDENT_AMBULATORY_CARE_PROVIDER_SITE_OTHER): Payer: BLUE CROSS/BLUE SHIELD | Admitting: Internal Medicine

## 2018-07-26 ENCOUNTER — Encounter: Payer: Self-pay | Admitting: Internal Medicine

## 2018-07-26 VITALS — BP 122/78 | HR 78 | Temp 97.9°F | Ht 63.5 in | Wt 219.0 lb

## 2018-07-26 DIAGNOSIS — Z23 Encounter for immunization: Secondary | ICD-10-CM

## 2018-07-26 DIAGNOSIS — E039 Hypothyroidism, unspecified: Secondary | ICD-10-CM | POA: Diagnosis not present

## 2018-07-26 DIAGNOSIS — Z0000 Encounter for general adult medical examination without abnormal findings: Secondary | ICD-10-CM | POA: Diagnosis not present

## 2018-07-26 NOTE — Patient Instructions (Signed)

## 2018-07-26 NOTE — Progress Notes (Signed)
Subjective:    Patient ID: Gloria Li, female    DOB: [birth year], 42 y.o.   MRN: [id number]  HPI  Pt presents to the clinic today for her annual exam.   Flu: never Tetanus: > 10 years Pap Smear: 04/2017 Mammogram: double mastectomy Vision Screening: annually Dentist: biannually  Diet: She does eat meat. She consumes fruits and veggies daily. She tries to avoid fried food. She drinks mostly water, coffee. Exercise: Walking for 30 minutes daily  Review of Systems      Past Medical History:  Diagnosis Date  . Anxiety   . BRCA negative 04/2017   MyRisk neg  . Bursitis   . Family history of breast cancer   . Genetic testing of female    BRCA negative  . Increased risk of breast cancer 04/2017   IBIS=23.65/riskscore=22%  . Premature ovarian failure   . Thyroid disease     Current Outpatient Medications  Medication Sig Dispense Refill  . Cholecalciferol (VITAMIN D3) 1000 units CAPS Take by mouth daily.    Marland Kitchen levothyroxine (SYNTHROID, LEVOTHROID) 100 MCG tablet Take by mouth.    . sertraline (ZOLOFT) 50 MG tablet TAKE 1 TABLET BY MOUTH  DAILY 90 tablet 3  . vitamin B-12 (CYANOCOBALAMIN) 1000 MCG tablet Take 1,000 mcg by mouth daily.     No current facility-administered medications for this visit.     Allergies  Allergen Reactions  . Penicillins Rash    As an infant  . Sulfa Antibiotics Rash    Family History  Problem Relation Age of Onset  . Bone cancer Mother 18  . Breast cancer Mother 37       second dx age 65  . Lung cancer Mother 33  . Glaucoma Father   . Colon cancer Cousin 59  . Hypertension Maternal Grandmother   . Heart failure Maternal Grandfather     Social History   Socioeconomic History  . Marital status: Married    Spouse name: Not on file  . Number of children: 1  . Years of education: Not on file  . Highest education level: Not on file  Occupational History  . Occupation: Team leader-third party  Social Needs  . Financial  resource strain: Not on file  . Food insecurity:    Worry: Not on file    Inability: Not on file  . Transportation needs:    Medical: Not on file    Non-medical: Not on file  Tobacco Use  . Smoking status: Never Smoker  . Smokeless tobacco: Never Used  Substance and Sexual Activity  . Alcohol use: Never    Frequency: Never  . Drug use: No  . Sexual activity: Yes    Partners: Male    Birth control/protection: Post-menopausal, Other-see comments    Comment: vasectomy  Lifestyle  . Physical activity:    Days per week: Not on file    Minutes per session: Not on file  . Stress: Not on file  Relationships  . Social connections:    Talks on phone: Not on file    Gets together: Not on file    Attends religious service: Not on file    Active member of club or organization: Not on file    Attends meetings of clubs or organizations: Not on file    Relationship status: Not on file  . Intimate partner violence:    Fear of current or ex partner: Not on file    Emotionally abused: Not on file  Physically abused: Not on file    Forced sexual activity: Not on file  Other Topics Concern  . Not on file  Social History Narrative  . Not on file     Constitutional: Denies fever, malaise, fatigue, headache or abrupt weight changes.  HEENT: Denies eye pain, eye redness, ear pain, ringing in the ears, wax buildup, runny nose, nasal congestion, bloody nose, or sore throat. Respiratory: Denies difficulty breathing, shortness of breath, cough or sputum production.   Cardiovascular: Denies chest pain, chest tightness, palpitations or swelling in the hands or feet.  Gastrointestinal: Pt reports intermittent reflux. Denies abdominal pain, bloating, constipation, diarrhea or blood in the stool.  GU: Denies urgency, frequency, pain with urination, burning sensation, blood in urine, odor or discharge. Musculoskeletal: Denies decrease in range of motion, difficulty with gait, muscle pain or joint  pain and swelling.  Skin: Denies redness, rashes, lesions or ulcercations.  Neurological: Denies dizziness, difficulty with memory, difficulty with speech or problems with balance and coordination.  Psych: Pt has a history of anxiety. Denies depression, SI/HI.  No other specific complaints in a complete review of systems (except as listed in HPI above).  Objective:   Physical Exam   BP 122/78   Pulse 78   Temp 97.9 F (36.6 C) (Oral)   Ht 5' 3.5" (1.613 m)   Wt 219 lb (99.3 kg)   SpO2 98%   BMI 38.19 kg/m  Wt Readings from Last 3 Encounters:  07/26/18 219 lb (99.3 kg)  06/16/18 217 lb 9.6 oz (98.7 kg)  05/06/18 214 lb 12 oz (97.4 kg)    General: Appears her stated age, obese, in NAD. Skin: Warm, dry and intact. No rashes noted. HEENT: Head: normal shape and size; Eyes: sclera white, no icterus, conjunctiva pink, PERRLA and EOMs intact; Ears: Tm's gray and intact, normal light reflex; Throat/Mouth: Teeth present, mucosa pink and moist, no exudate, lesions or ulcerations noted.  Neck:  Neck supple, trachea midline. No masses, lumps  present.  Cardiovascular: Normal rate and rhythm. S1,S2 noted.  No murmur, rubs or gallops noted. No JVD or BLE edema.  Pulmonary/Chest: Normal effort and positive vesicular breath sounds. No respiratory distress. No wheezes, rales or ronchi noted.  Abdomen: Soft and nontender. Normal bowel sounds. No distention or masses noted. Liver, spleen and kidneys non palpable. Musculoskeletal: Strength 5/5 BUE/BLE. No difficulty with gait.  Neurological: Alert and oriented. Cranial nerves II-XII grossly intact. Coordination normal.  Psychiatric: Mood and affect normal. Behavior is normal. Judgment and thought content normal.     BMET    Component Value Date/Time   CREATININE 1.03 03/07/2018    Lipid Panel     Component Value Date/Time   CHOL 175 03/07/2018   TRIG 128 03/07/2018   TRIG 128 03/07/2018   HDL 41 03/07/2018   LDLCALC 108 03/07/2018     CBC    Component Value Date/Time   WBC 9.0 12/01/2016 1131   RBC 5.08 12/01/2016 1131   HGB 14.7 12/01/2016 1131   HCT 44.5 12/01/2016 1131   PLT 271 12/01/2016 1131   MCV 88 12/01/2016 1131   MCH 28.9 12/01/2016 1131   MCHC 33.0 12/01/2016 1131   RDW 13.8 12/01/2016 1131   LYMPHSABS 1.8 12/01/2016 1131   EOSABS 0.1 12/01/2016 1131   BASOSABS 0.0 12/01/2016 1131    Hgb A1C No results found for: HGBA1C         Assessment & Plan:   Preventative Health Maintenance:  She declines flu  shots Tdap today She is not a candidate for mammograms Pap smear UTD Encouraged her to consume a balanced diet and exercise regimen Advised her to see an eye doctor and dentist annually Will check CBC, CMET, Lipid, TSH and Free T4 today   RTC in 1 year, sooner if needed Webb Silversmith, NP

## 2018-07-27 LAB — COMPREHENSIVE METABOLIC PANEL
ALT: 22 IU/L (ref 0–32)
AST: 24 IU/L (ref 0–40)
Albumin/Globulin Ratio: 1.6 (ref 1.2–2.2)
Albumin: 4.7 g/dL (ref 3.5–5.5)
Alkaline Phosphatase: 76 IU/L (ref 39–117)
BUN/Creatinine Ratio: 17 (ref 9–23)
BUN: 16 mg/dL (ref 6–24)
Bilirubin Total: 0.3 mg/dL (ref 0.0–1.2)
CO2: 20 mmol/L (ref 20–29)
Calcium: 9.8 mg/dL (ref 8.7–10.2)
Chloride: 104 mmol/L (ref 96–106)
Creatinine, Ser: 0.93 mg/dL (ref 0.57–1.00)
GFR calc Af Amer: 86 mL/min/{1.73_m2} (ref >59)
GFR calc non Af Amer: 75 mL/min/{1.73_m2} (ref >59)
Globulin, Total: 2.9 g/dL (ref 1.5–4.5)
Glucose: 96 mg/dL (ref 65–99)
Potassium: 4.2 mmol/L (ref 3.5–5.2)
Sodium: 144 mmol/L (ref 134–144)
Total Protein: 7.6 g/dL (ref 6.0–8.5)

## 2018-07-27 LAB — CBC
Hematocrit: 43.4 % (ref 34.0–46.6)
Hemoglobin: 14.4 g/dL (ref 11.1–15.9)
MCH: 29.1 pg (ref 26.6–33.0)
MCHC: 33.2 g/dL (ref 31.5–35.7)
MCV: 88 fL (ref 79–97)
Platelets: 251 10*3/uL (ref 150–450)
RBC: 4.94 x10E6/uL (ref 3.77–5.28)
RDW: 13.1 % (ref 12.3–15.4)
WBC: 6.8 10*3/uL (ref 3.4–10.8)

## 2018-07-27 LAB — LIPID PANEL
Chol/HDL Ratio: 5.2 ratio — ABNORMAL HIGH (ref 0.0–4.4)
Cholesterol, Total: 213 mg/dL — ABNORMAL HIGH (ref 100–199)
HDL: 41 mg/dL (ref >39)
LDL Calculated: 126 mg/dL — ABNORMAL HIGH (ref 0–99)
TRIGLYCERIDES: 229 mg/dL — AB (ref 0–149)
VLDL CHOLESTEROL CAL: 46 mg/dL — AB (ref 5–40)

## 2018-07-27 LAB — TSH: TSH: 2.51 u[IU]/mL (ref 0.450–4.500)

## 2018-07-27 LAB — T4, FREE: Free T4: 1.62 ng/dL (ref 0.82–1.77)

## 2018-07-27 NOTE — Addendum Note (Signed)
Addended by: Lurlean Nanny on: 07/27/2018 01:54 PM   Modules accepted: Orders

## 2018-08-03 ENCOUNTER — Ambulatory Visit: Payer: BLUE CROSS/BLUE SHIELD | Admitting: Gastroenterology

## 2018-08-19 HISTORY — PX: CARPAL TUNNEL RELEASE: SHX101

## 2018-10-24 DIAGNOSIS — E559 Vitamin D deficiency, unspecified: Secondary | ICD-10-CM | POA: Insufficient documentation

## 2018-10-24 DIAGNOSIS — E538 Deficiency of other specified B group vitamins: Secondary | ICD-10-CM | POA: Insufficient documentation

## 2019-01-06 IMAGING — CR DG CHEST 2V
1 series · 2 of 2 positions shown · non-contrast
Comparison: November 25, 2016

CLINICAL DATA: Left-sided chest pain

EXAM:
CHEST  2 VIEW

[Series 1: dg chest 2 view · 0.14mm/px · 2 of 2 slices shown]
[im 1/2]
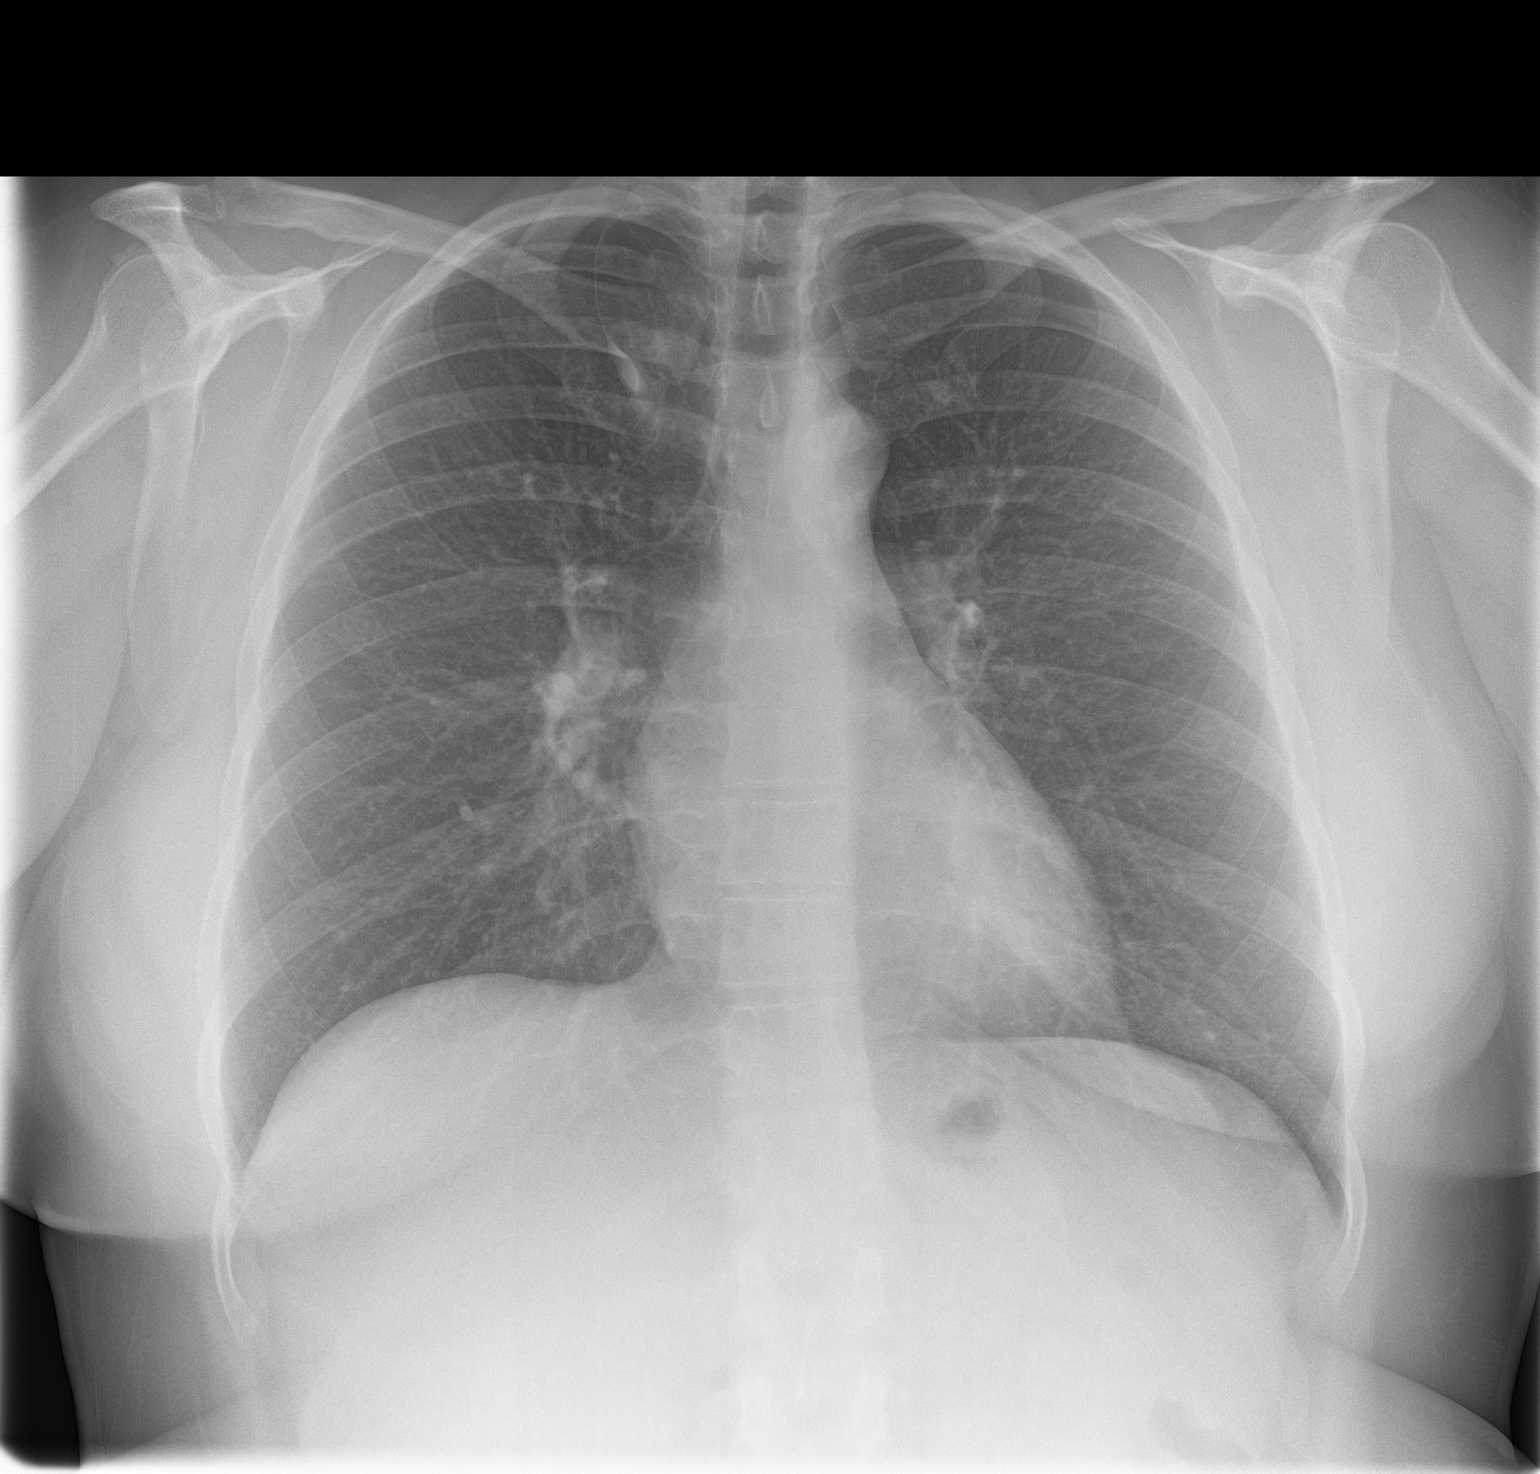
[im 2/2]
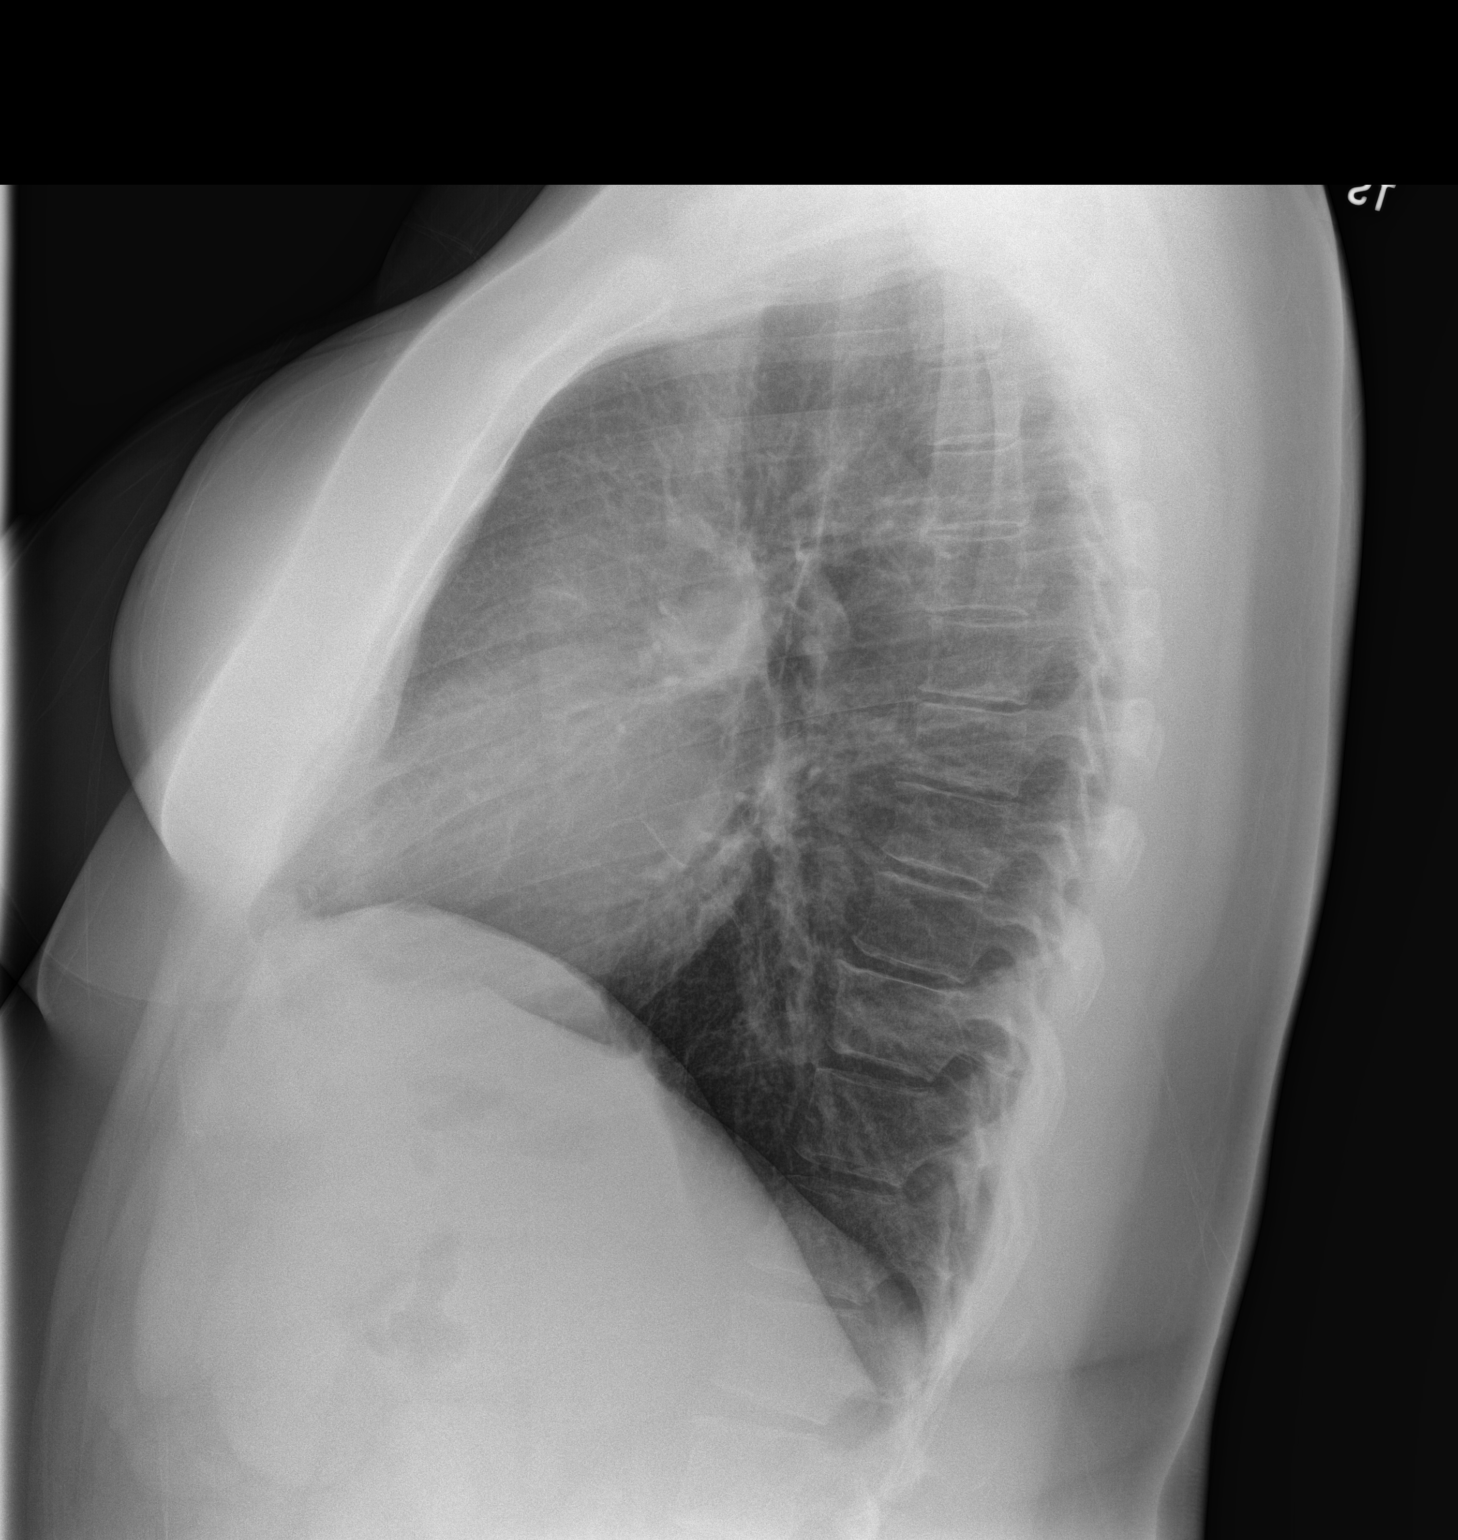

[2 of 2 positions shown; findings below may reference images not displayed]

FINDINGS: Currently lungs are clear. Heart size and pulmonary vascularity are
normal. No adenopathy. No bone lesions. There is an azygos lobe on
the right, an anatomic variant.
IMPRESSION: No abnormality noted.  Lungs currently clear.

## 2019-04-20 ENCOUNTER — Ambulatory Visit: Payer: Managed Care, Other (non HMO) | Admitting: Internal Medicine

## 2019-04-20 ENCOUNTER — Other Ambulatory Visit: Payer: Self-pay

## 2019-04-20 ENCOUNTER — Encounter: Payer: Self-pay | Admitting: Internal Medicine

## 2019-04-20 VITALS — BP 124/78 | HR 94 | Temp 98.5°F | Wt 219.0 lb

## 2019-04-20 DIAGNOSIS — N952 Postmenopausal atrophic vaginitis: Secondary | ICD-10-CM | POA: Diagnosis not present

## 2019-04-20 DIAGNOSIS — N941 Unspecified dyspareunia: Secondary | ICD-10-CM

## 2019-04-20 LAB — POC URINALSYSI DIPSTICK (AUTOMATED)
Bilirubin, UA: NEGATIVE
Blood, UA: NEGATIVE
Glucose, UA: NEGATIVE
Ketones, UA: NEGATIVE
Leukocytes, UA: NEGATIVE
Nitrite, UA: NEGATIVE
Protein, UA: NEGATIVE
Spec Grav, UA: >=1.03 — AB (ref 1.010–1.025)
Urobilinogen, UA: 0.2 E.U./dL
pH, UA: 5.5 (ref 5.0–8.0)

## 2019-04-20 MED ORDER — ESTROGENS, CONJUGATED 0.625 MG/GM VA CREA: 1.0000 | TOPICAL_CREAM | VAGINAL | 12 refills | Status: DC

## 2019-04-20 NOTE — Addendum Note (Signed)
Addended by: Cloyd Stagers on: 04/20/2019 05:19 PM   Modules accepted: Orders

## 2019-04-20 NOTE — Patient Instructions (Signed)
Dyspareunia, Female Dyspareunia is pain that is associated with sexual activity. This can affect any part of the genitals or lower abdomen. There are many possible causes of this condition. In some cases, diagnosing the cause of dyspareunia can be difficult. This condition can be mild, moderate, or severe. Depending on the cause, dyspareunia may get better with treatment, but may return (recur) over time. What are the causes?  The cause of this condition is not always known. However, problems that affect the vulva, vagina, uterus, and other organs may cause dyspareunia. Common causes of this condition include:  Vaginal dryness.  Giving birth.  Infection.  Skin changes or conditions.  Side effects of medicines.  Endometriosis. This is when tissue that is like the lining of the uterus grows on the outside of the uterus.  Psychological conditions. These include depression, anxiety, or traumatic experiences.  Allergic reaction. What increases the risk? The following factors may make you more likely to develop this condition:  History of physical or sexual trauma.  Some medicines.  No longer having a monthly period (menopause).  Having recently given birth.  Taking baths using soaps that have perfumes. These can cause irritation.  Douching. What are the signs or symptoms? The main symptom of this condition is pain in any part of your genitals or lower abdomen during or after sex. This may include:  Irritation, burning, or stinging sensations in your vulva.  Discomfort when your vulva or surrounding area is touched.  Aching and throbbing pain that may be constant.  Pain that gets worse when something is inserted into your vagina. How is this diagnosed? This condition may be diagnosed based on:  Your symptoms, including where and when your pain occurs.  Your medical history.  A physical exam. A pelvic exam will most likely be done.  Tests that include ultrasound,  blood tests, and tests that check the body for infection.  Imaging tests, such as X-ray, MRI, and CT scan. You may be referred to a health care provider who specializes in women's health (gynecologist). How is this treated? Treatment depends on the cause of your condition and your symptoms. In most cases, you may need to stop sexual activity until your symptoms go away or get better. Treatment may include:  Lubricants, ointments, and creams.  Physical therapy.  Massage therapy.  Hormonal therapy.  Medicines to: ? Prevent or fight infection. ? Relieve pain. ? Help numb the area. ? Treat depression (antidepressants).  Counseling, which may include sex therapy.  Surgery. Follow these instructions at home: Lifestyle  Wear cotton underwear.  Use water-based lubricants as needed during sex. Avoid oil-based lubricants.  Do not use any products that can cause irritation. This may include certain condoms, spermicides, lubricants, soaps, tampons, vaginal sprays, or douches.  Always practice safe sex. Use a condom to prevent sexually transmitted infections (STIs).  Talk freely with your partner about your condition. General instructions  Take or apply over-the-counter and prescription medicines only as told by your health care provider.  Urinate before you have sex.  Consider joining a support group.  Get the results of any tests you have done. Ask your health care provider, or the department that is doing the procedure, when your results will be ready.  Keep all follow-up visits as told by your health care provider. This is important. Contact a health care provider if:  You have vaginal bleeding after having sex.  You develop a lump at the opening of your vagina even if the   lump is painless.  You have: ? Abnormal discharge from your vagina. ? Vaginal dryness. ? Itchiness or irritation of your vulva or vagina. ? A new rash. ? Symptoms that get worse or do not improve  with treatment. ? A fever. ? Pain when you urinate. ? Blood in your urine. Get help right away if:  You have severe pain in your abdomen during or shortly after sex.  You pass out after sex. Summary  Dyspareunia is pain that is associated with sexual activity. This can affect any part of the genitals or lower abdomen.  There are many causes of this condition. Treatment depends on the cause and your symptoms. In most cases, you may need to stop sexual activity until your symptoms improve.  Take or apply over-the-counter and prescription medicines only as told by your health care provider.  Contact a health care provider if your symptoms get worse or do not improve with treatment.  Keep all follow-up visits as told by your health care provider. This is important. This information is not intended to replace advice given to you by your health care provider. Make sure you discuss any questions you have with your health care provider. Document Released: 08/23/2007 Document Revised: 10/10/2018 Document Reviewed: 10/10/2018 Elsevier Patient Education  2020 [employer]  

## 2019-04-20 NOTE — Progress Notes (Signed)
Subjective:    Patient ID: Gloria Li, female    DOB: 11-23-1975, 43 y.o.   MRN: [id number]  HPI  Pt presents to the clinic today with c/p painful intercourse. She noticed this 1-2 weeks ago. She reports vaginal pain and irritation which she describes as burning. She denies urinary frequency, urgency, dysuria or blood in her urine. She denies vaginal discharge, odor or abnormal bleeding. She reports she is postmenopausal for about 7 years.  Review of Systems      Past Medical History:  Diagnosis Date  . Anxiety   . BRCA negative 04/2017   MyRisk neg  . Bursitis   . Family history of breast cancer   . Genetic testing of female    BRCA negative  . Increased risk of breast cancer 04/2017   IBIS=23.65/riskscore=22%  . Premature ovarian failure   . Thyroid disease     Current Outpatient Medications  Medication Sig Dispense Refill  . Cholecalciferol (VITAMIN D3) 1000 units CAPS Take by mouth daily.    Marland Kitchen levothyroxine (SYNTHROID, LEVOTHROID) 100 MCG tablet Take by mouth.    . sertraline (ZOLOFT) 50 MG tablet TAKE 1 TABLET BY MOUTH  DAILY 90 tablet 3  . vitamin B-12 (CYANOCOBALAMIN) 1000 MCG tablet Take 1,000 mcg by mouth daily.    Marland Kitchen conjugated estrogens (PREMARIN) vaginal cream Place 1 Applicatorful vaginally 2 (two) times a week. 42.5 g 12   No current facility-administered medications for this visit.     Allergies  Allergen Reactions  . Penicillins Rash    As an infant  . Sulfa Antibiotics Rash    Family History  Problem Relation Age of Onset  . Bone cancer Mother 85  . Breast cancer Mother 93       second dx age 45  . Lung cancer Mother 32  . Glaucoma Father   . Colon cancer Cousin 22  . Hypertension Maternal Grandmother   . Heart failure Maternal Grandfather     Social History   Socioeconomic History  . Marital status: Married    Spouse name: Not on file  . Number of children: 1  . Years of education: Not on file  . Highest education level: Not  on file  Occupational History  . Occupation: Team leader-third party  Social Needs  . Financial resource strain: Not on file  . Food insecurity    Worry: Not on file    Inability: Not on file  . Transportation needs    Medical: Not on file    Non-medical: Not on file  Tobacco Use  . Smoking status: Never Smoker  . Smokeless tobacco: Never Used  Substance and Sexual Activity  . Alcohol use: Never    Frequency: Never  . Drug use: No  . Sexual activity: Yes    Partners: Male    Birth control/protection: Post-menopausal, Other-see comments    Comment: vasectomy  Lifestyle  . Physical activity    Days per week: Not on file    Minutes per session: Not on file  . Stress: Not on file  Relationships  . Social Herbalist on phone: Not on file    Gets together: Not on file    Attends religious service: Not on file    Active member of club or organization: Not on file    Attends meetings of clubs or organizations: Not on file    Relationship status: Not on file  . Intimate partner violence    Fear  of current or ex partner: Not on file    Emotionally abused: Not on file    Physically abused: Not on file    Forced sexual activity: Not on file  Other Topics Concern  . Not on file  Social History Narrative  . Not on file     Constitutional: Denies fever, malaise, fatigue, headache or abrupt weight changes.  Respiratory: Denies difficulty breathing, shortness of breath, cough or sputum production.   Cardiovascular: Denies chest pain, chest tightness, palpitations or swelling in the hands or feet.  Gastrointestinal: Denies abdominal pain, bloating, constipation, diarrhea or blood in the stool.  GU: Pt reports vaginal pain with intercourse. Denies urgency, frequency, pain with urination, burning sensation, blood in urine, odor or discharge.   No other specific complaints in a complete review of systems (except as listed in HPI above).  Objective:   Physical Exam  BP  124/78   Pulse 94   Temp 98.5 F (36.9 C) (Temporal)   Wt 219 lb (99.3 kg)   LMP 05/19/2011   SpO2 98%   BMI 38.19 kg/m  Wt Readings from Last 3 Encounters:  04/20/19 219 lb (99.3 kg)  07/26/18 219 lb (99.3 kg)  06/16/18 217 lb 9.6 oz (98.7 kg)    General: Appears her stated age, obese, in NAD. Cardiovascular: Normal rate and rhythm. S1,S2 noted.  No murmur, rubs or gallops noted.  Pulmonary/Chest: Normal effort and positive vesicular breath sounds. No respiratory distress. No wheezes, rales or ronchi noted.  Abdomen: Soft and nontender. Normal bowel sounds. No distention or masses noted.  Neurological: Alert and oriented.    BMET    Component Value Date/Time   NA 144 07/26/2018 0847   K 4.2 07/26/2018 0847   CL 104 07/26/2018 0847   CO2 20 07/26/2018 0847   GLUCOSE 96 07/26/2018 0847   BUN 16 07/26/2018 0847   CREATININE 0.93 07/26/2018 0847   CREATININE 1.03 03/07/2018   CALCIUM 9.8 07/26/2018 0847   GFRNONAA 75 07/26/2018 0847   GFRAA 86 07/26/2018 0847    Lipid Panel     Component Value Date/Time   CHOL 213 (H) 07/26/2018 0847   CHOL 175 03/07/2018   TRIG 229 (H) 07/26/2018 0847   TRIG 128 03/07/2018   TRIG 128 03/07/2018   HDL 41 07/26/2018 0847   HDL 41 03/07/2018   CHOLHDL 5.2 (H) 07/26/2018 0847   LDLCALC 126 (H) 07/26/2018 0847    CBC    Component Value Date/Time   WBC 6.8 07/26/2018 0847   RBC 4.94 07/26/2018 0847   HGB 14.4 07/26/2018 0847   HCT 43.4 07/26/2018 0847   PLT 251 07/26/2018 0847   MCV 88 07/26/2018 0847   MCH 29.1 07/26/2018 0847   MCHC 33.2 07/26/2018 0847   RDW 13.1 07/26/2018 0847   LYMPHSABS 1.8 12/01/2016 1131   EOSABS 0.1 12/01/2016 1131   BASOSABS 0.0 12/01/2016 1131    Hgb A1C No results found for: HGBA1C          Assessment & Plan:   Dyspareunia:  Urinalysis: normal No need to send to urine culture Will send urine for gonorrhea, and chlamydia Will send off wet prep RX for Premarin Cream 0.625 mg  intravaginally 2 x week Used water based lubricant for intercourse  Return precautions discussed Webb Silversmith, NP

## 2019-04-20 NOTE — Addendum Note (Signed)
Addended by: Lindalou Hose Y on: 04/20/2019 05:00 PM   Modules accepted: Orders

## 2019-04-21 LAB — SPECIMEN STATUS REPORT

## 2019-04-24 LAB — VAGINITIS/VAGINOSIS, DNA PROBE
Candida Species: NEGATIVE
Gardnerella vaginalis: NEGATIVE
Trichomonas vaginosis: NEGATIVE

## 2019-04-24 LAB — GC/CHLAMYDIA PROBE AMP
Chlamydia trachomatis, NAA: NEGATIVE
Neisseria Gonorrhoeae by PCR: NEGATIVE

## 2019-04-24 LAB — WET PREP FOR TRICH, YEAST, CLUE

## 2019-07-01 ENCOUNTER — Other Ambulatory Visit: Payer: Self-pay | Admitting: Internal Medicine

## 2019-07-01 DIAGNOSIS — F4323 Adjustment disorder with mixed anxiety and depressed mood: Secondary | ICD-10-CM

## 2019-07-04 NOTE — Telephone Encounter (Signed)
CPE due letter mailed

## 2019-08-30 ENCOUNTER — Other Ambulatory Visit: Payer: Self-pay

## 2019-08-30 ENCOUNTER — Ambulatory Visit (INDEPENDENT_AMBULATORY_CARE_PROVIDER_SITE_OTHER): Payer: Managed Care, Other (non HMO) | Admitting: Internal Medicine

## 2019-08-30 ENCOUNTER — Encounter: Payer: Self-pay | Admitting: Internal Medicine

## 2019-08-30 VITALS — BP 126/80 | HR 77 | Temp 97.6°F | Ht 63.0 in | Wt 218.0 lb

## 2019-08-30 DIAGNOSIS — Z0000 Encounter for general adult medical examination without abnormal findings: Secondary | ICD-10-CM

## 2019-08-30 DIAGNOSIS — E039 Hypothyroidism, unspecified: Secondary | ICD-10-CM | POA: Diagnosis not present

## 2019-08-30 DIAGNOSIS — F411 Generalized anxiety disorder: Secondary | ICD-10-CM

## 2019-08-30 MED ORDER — SERTRALINE HCL 100 MG PO TABS: 100.0000 mg | ORAL_TABLET | Freq: Every day | ORAL | 3 refills | Status: DC

## 2019-08-30 MED ORDER — HYDROXYZINE HCL 10 MG PO TABS: 10.0000 mg | ORAL_TABLET | Freq: Every day | ORAL | 0 refills | Status: DC | PRN

## 2019-08-30 NOTE — Progress Notes (Signed)
Subjective:    Patient ID: Gloria Li, female    DOB: 06/05/1976, 44 y.o.   MRN: [id number]  HPI  Pt presents to the clinic today for her annual exam. She is also due to follow up chronic conditions.  GAD: Deteriorated. Triggered by flying, occurs on vacation mainly at night (she is unsure why), driving over bridges. She reports when she is home, she is fine. Managed on Sertraline which she feels like it is working well for her. She is not currently seeing a therapist. She denies depression, SI/HI.  Hypothyroidism: She reports increased constipation on her current dose of Levothyroxine. She does not follow with endocrinology.  Postmenopausal Vaginal Atrophy: Managed with Premarin.   Flu: never Tetanus: 07/2018 Pap Smear: 04/2017 Mammogram: double mastectomy Colon Screening: never Vision Screening: annually Dentist: biannually  Diet: She does eat meat. She consumes fruits and veggies daily. She does eat some fried foods. She drinks mostly coffee, water, seltzer water. Exercise: None  Review of Systems      Past Medical History:  Diagnosis Date  . Anxiety   . BRCA negative 04/2017   MyRisk neg  . Bursitis   . Family history of breast cancer   . Genetic testing of female    BRCA negative  . Increased risk of breast cancer 04/2017   IBIS=23.65/riskscore=22%  . Premature ovarian failure   . Thyroid disease     Current Outpatient Medications  Medication Sig Dispense Refill  . Cholecalciferol (VITAMIN D3) 1000 units CAPS Take by mouth daily.    Marland Kitchen conjugated estrogens (PREMARIN) vaginal cream Place 1 Applicatorful vaginally 2 (two) times a week. 42.5 g 12  . levothyroxine (SYNTHROID, LEVOTHROID) 100 MCG tablet Take by mouth.    . sertraline (ZOLOFT) 50 MG tablet Take 1 tablet (50 mg total) by mouth daily. MUST SCHEDULE PHYSICAL EXAM 90 tablet 0  . vitamin B-12 (CYANOCOBALAMIN) 1000 MCG tablet Take 1,000 mcg by mouth daily.     No current facility-administered  medications for this visit.    Allergies  Allergen Reactions  . Penicillins Rash    As an infant  . Sulfa Antibiotics Rash    Family History  Problem Relation Age of Onset  . Bone cancer Mother 65  . Breast cancer Mother 68       second dx age 15  . Lung cancer Mother 81  . Glaucoma Father   . Colon cancer Cousin 28  . Hypertension Maternal Grandmother   . Heart failure Maternal Grandfather     Social History   Socioeconomic History  . Marital status: Married    Spouse name: Not on file  . Number of children: 1  . Years of education: Not on file  . Highest education level: Not on file  Occupational History  . Occupation: Team leader-third party  Tobacco Use  . Smoking status: Never Smoker  . Smokeless tobacco: Never Used  Substance and Sexual Activity  . Alcohol use: Never  . Drug use: No  . Sexual activity: Yes    Partners: Male    Birth control/protection: Post-menopausal, Other-see comments    Comment: vasectomy  Other Topics Concern  . Not on file  Social History Narrative  . Not on file   Social Determinants of Health   Financial Resource Strain:   . Difficulty of Paying Living Expenses: Not on file  Food Insecurity:   . Worried About Charity fundraiser in the Last Year: Not on file  . Ran  Out of Food in the Last Year: Not on file  Transportation Needs:   . Lack of Transportation (Medical): Not on file  . Lack of Transportation (Non-Medical): Not on file  Physical Activity:   . Days of Exercise per Week: Not on file  . Minutes of Exercise per Session: Not on file  Stress:   . Feeling of Stress : Not on file  Social Connections:   . Frequency of Communication with Friends and Family: Not on file  . Frequency of Social Gatherings with Friends and Family: Not on file  . Attends Religious Services: Not on file  . Active Member of Clubs or Organizations: Not on file  . Attends Archivist Meetings: Not on file  . Marital Status: Not on  file  Intimate Partner Violence:   . Fear of Current or Ex-Partner: Not on file  . Emotionally Abused: Not on file  . Physically Abused: Not on file  . Sexually Abused: Not on file     Constitutional: Denies fever, malaise, fatigue, headache or abrupt weight changes.  HEENT: Denies eye pain, eye redness, ear pain, ringing in the ears, wax buildup, runny nose, nasal congestion, bloody nose, or sore throat. Respiratory: Denies difficulty breathing, shortness of breath, cough or sputum production.   Cardiovascular: Denies chest pain, chest tightness, palpitations or swelling in the hands or feet.  Gastrointestinal: Pt reports constipation, hemorrhoids. Denies abdominal pain, bloating, diarrhea or blood in the stool.  GU: Denies urgency, frequency, pain with urination, burning sensation, blood in urine, odor or discharge. Musculoskeletal: Denies decrease in range of motion, difficulty with gait, muscle pain or joint pain and swelling.  Skin: Denies redness, rashes, lesions or ulcercations.  Neurological: Denies dizziness, difficulty with memory, difficulty with speech or problems with balance and coordination.  Psych: Pt has a history of anxiety. Denies depression, SI/HI.  No other specific complaints in a complete review of systems (except as listed in HPI above).  Objective:   Physical Exam   LMP 05/19/2011  Wt Readings from Last 3 Encounters:  04/20/19 219 lb (99.3 kg)  07/26/18 219 lb (99.3 kg)  06/16/18 217 lb 9.6 oz (98.7 kg)    General: Appears her stated age, obese, in NAD. Skin: Warm, dry and intact. No rashesnoted. HEENT: Head: normal shape and size; Eyes: sclera white, no icterus, conjunctiva pink, PERRLA and EOMs intact;  Neck:  Neck supple, trachea midline. No masses, lumps present.  Cardiovascular: Normal rate and rhythm. S1,S2 noted.  No murmur, rubs or gallops noted. No JVD or BLE edema.  Pulmonary/Chest: Normal effort and positive vesicular breath sounds. No  respiratory distress. No wheezes, rales or ronchi noted.  Abdomen: Soft and nontender. Normal bowel sounds. No distention or masses noted. Liver, spleen and kidneys non palpable. Musculoskeletal: Strength 5/5 BUE/BLE. No difficulty with gait.  Neurological: Alert and oriented. Cranial nerves II-XII grossly intact. Coordination normal.  Psychiatric: Mood and affect normal. Behavior is normal. Judgment and thought content normal.     BMET    Component Value Date/Time   NA 144 07/26/2018 0847   K 4.2 07/26/2018 0847   CL 104 07/26/2018 0847   CO2 20 07/26/2018 0847   GLUCOSE 96 07/26/2018 0847   BUN 16 07/26/2018 0847   CREATININE 0.93 07/26/2018 0847   CREATININE 1.03 03/07/2018 0000   CALCIUM 9.8 07/26/2018 0847   GFRNONAA 75 07/26/2018 0847   GFRAA 86 07/26/2018 0847    Lipid Panel     Component Value Date/Time  CHOL 213 (H) 07/26/2018 0847   CHOL 175 03/07/2018 0000   TRIG 229 (H) 07/26/2018 0847   TRIG 128 03/07/2018 0000   TRIG 128 03/07/2018 0000   HDL 41 07/26/2018 0847   HDL 41 03/07/2018 0000   CHOLHDL 5.2 (H) 07/26/2018 0847   LDLCALC 126 (H) 07/26/2018 0847    CBC    Component Value Date/Time   WBC 6.8 07/26/2018 0847   RBC 4.94 07/26/2018 0847   HGB 14.4 07/26/2018 0847   HCT 43.4 07/26/2018 0847   PLT 251 07/26/2018 0847   MCV 88 07/26/2018 0847   MCH 29.1 07/26/2018 0847   MCHC 33.2 07/26/2018 0847   RDW 13.1 07/26/2018 0847   LYMPHSABS 1.8 12/01/2016 1131   EOSABS 0.1 12/01/2016 1131   BASOSABS 0.0 12/01/2016 1131    Hgb A1C No results found for: HGBA1C         Assessment & Plan:   Preventative Health Maintneance:  She declines flu shot Tetanus UTD Pap smear UTD No longer needs mammograms We will discuss getting a bone density at next CPE Referral to GI for screening colonoscopy Encouraged her to consume a balanced diet and exercise regimen Advised her to see an eye doctor and dentist annually Will check CBC, CMET, Lipid, TSH,  Free T4, A1C, B12 and Vit D today  RTC in 1 year, sooner if needed Webb Silversmith, NP This visit occurred during the SARS-CoV-2 public health emergency.  Safety protocols were in place, including screening questions prior to the visit, additional usage of staff PPE, and extensive cleaning of exam room while observing appropriate contact time as indicated for disinfecting solutions.

## 2019-08-30 NOTE — Patient Instructions (Signed)

## 2019-08-30 NOTE — Assessment & Plan Note (Signed)
Deteriorated Support offered today Increase Sertraline to 100 mg PO daily RX for Hydroxyzine 10 mg daily prn- sedation caution given Consider referral for therapy if no improvement

## 2019-08-30 NOTE — Assessment & Plan Note (Signed)
Will check TSH and Free T4 today °Will adjust Levothyroxine if needed based on labs °

## 2019-08-31 LAB — CBC
Hematocrit: 44.5 % (ref 34.0–46.6)
Hemoglobin: 14.9 g/dL (ref 11.1–15.9)
MCH: 29.4 pg (ref 26.6–33.0)
MCHC: 33.5 g/dL (ref 31.5–35.7)
MCV: 88 fL (ref 79–97)
Platelets: 256 10*3/uL (ref 150–450)
RBC: 5.07 x10E6/uL (ref 3.77–5.28)
RDW: 12.9 % (ref 11.7–15.4)
WBC: 8.9 10*3/uL (ref 3.4–10.8)

## 2019-08-31 LAB — HEMOGLOBIN A1C
Est. average glucose Bld gHb Est-mCnc: 111 mg/dL
Hgb A1c MFr Bld: 5.5 % (ref 4.8–5.6)

## 2019-08-31 LAB — LIPID PANEL
Chol/HDL Ratio: 4.9 ratio — ABNORMAL HIGH (ref 0.0–4.4)
Cholesterol, Total: 190 mg/dL (ref 100–199)
HDL: 39 mg/dL — ABNORMAL LOW (ref >39)
LDL Chol Calc (NIH): 112 mg/dL — ABNORMAL HIGH (ref 0–99)
Triglycerides: 222 mg/dL — ABNORMAL HIGH (ref 0–149)
VLDL Cholesterol Cal: 39 mg/dL (ref 5–40)

## 2019-08-31 LAB — COMPREHENSIVE METABOLIC PANEL
ALT: 15 IU/L (ref 0–32)
AST: 15 IU/L (ref 0–40)
Albumin/Globulin Ratio: 1.9 (ref 1.2–2.2)
Albumin: 4.6 g/dL (ref 3.8–4.8)
Alkaline Phosphatase: 80 IU/L (ref 39–117)
BUN/Creatinine Ratio: 16 (ref 9–23)
BUN: 15 mg/dL (ref 6–24)
Bilirubin Total: 0.2 mg/dL (ref 0.0–1.2)
CO2: 26 mmol/L (ref 20–29)
Calcium: 9.8 mg/dL (ref 8.7–10.2)
Chloride: 104 mmol/L (ref 96–106)
Creatinine, Ser: 0.91 mg/dL (ref 0.57–1.00)
GFR calc Af Amer: 88 mL/min/{1.73_m2} (ref >59)
GFR calc non Af Amer: 76 mL/min/{1.73_m2} (ref >59)
Globulin, Total: 2.4 g/dL (ref 1.5–4.5)
Glucose: 92 mg/dL (ref 65–99)
Potassium: 4.3 mmol/L (ref 3.5–5.2)
Sodium: 141 mmol/L (ref 134–144)
Total Protein: 7 g/dL (ref 6.0–8.5)

## 2019-08-31 LAB — VITAMIN D 25 HYDROXY (VIT D DEFICIENCY, FRACTURES): Vit D, 25-Hydroxy: 28.2 ng/mL — ABNORMAL LOW (ref 30.0–100.0)

## 2019-08-31 LAB — T4, FREE: Free T4: 1.6 ng/dL (ref 0.82–1.77)

## 2019-08-31 LAB — VITAMIN B12: Vitamin B-12: 492 pg/mL (ref 232–1245)

## 2019-08-31 LAB — TSH: TSH: 2.58 u[IU]/mL (ref 0.450–4.500)

## 2019-09-12 ENCOUNTER — Other Ambulatory Visit: Payer: Self-pay

## 2019-09-12 ENCOUNTER — Telehealth: Payer: Self-pay

## 2019-09-12 ENCOUNTER — Telehealth: Payer: Self-pay | Admitting: Gastroenterology

## 2019-09-12 DIAGNOSIS — Z1211 Encounter for screening for malignant neoplasm of colon: Secondary | ICD-10-CM

## 2019-09-12 MED ORDER — NA SULFATE-K SULFATE-MG SULF 17.5-3.13-1.6 GM/177ML PO SOLN: 354.0000 mL | Freq: Once | ORAL | 0 refills | Status: AC

## 2019-09-12 NOTE — Telephone Encounter (Addendum)
Gastroenterology Pre-Procedure Review   PATIENT REVIEW QUESTIONS: The patient responded to the following health history questions as indicated:    1. Are you having any GI issues? no 2. Do you have a personal history of Polyps? no 3. Do you have a family history of Colon Cancer or Polyps? no 4. Diabetes Mellitus? no 5. Joint replacements in the past 12 months?no 6. Major health problems in the past 3 months?no 7. Any artificial heart valves, MVP, or defibrillator?no    MEDICATIONS & ALLERGIES:    Patient reports the following regarding taking any anticoagulation/antiplatelet therapy:   Plavix, Coumadin, Eliquis, Xarelto, Lovenox, Pradaxa, Brilinta, or Effient? no Aspirin? no  Patient confirms/reports the following medications:  Current Outpatient Medications  Medication Sig Dispense Refill  . Cholecalciferol (VITAMIN D3) 1000 units CAPS Take by mouth daily.    Marland Kitchen conjugated estrogens (PREMARIN) vaginal cream Place 1 Applicatorful vaginally 2 (two) times a week. 42.5 g 12  . hydrOXYzine (ATARAX/VISTARIL) 10 MG tablet Take 1 tablet (10 mg total) by mouth daily as needed. 20 tablet 0  . levothyroxine (SYNTHROID, LEVOTHROID) 100 MCG tablet Take by mouth.    . sertraline (ZOLOFT) 100 MG tablet Take 1 tablet (100 mg total) by mouth daily. 90 tablet 3  . vitamin B-12 (CYANOCOBALAMIN) 1000 MCG tablet Take 1,000 mcg by mouth daily.     No current facility-administered medications for this visit.    Patient confirms/reports the following allergies:  Allergies  Allergen Reactions  . Penicillins Rash    As an infant  . Sulfa Antibiotics Rash    No orders of the defined types were placed in this encounter.   AUTHORIZATION INFORMATION Primary Insurance: 1D#: Group #:  Secondary Insurance: 1D#: Group #:  SCHEDULE INFORMATION: Date: 11/03/2019 Time: Location: [place]

## 2019-09-12 NOTE — Telephone Encounter (Signed)
Pt left vm she needs to move her procedure from 11/03/19 to 11/10/19  Due to being out of town on 11/03/19 please call pt

## 2019-09-20 ENCOUNTER — Other Ambulatory Visit: Payer: Self-pay | Admitting: Internal Medicine

## 2019-09-21 MED ORDER — SERTRALINE HCL 100 MG PO TABS: 100.0000 mg | ORAL_TABLET | Freq: Every day | ORAL | 1 refills | Status: DC

## 2019-10-06 ENCOUNTER — Other Ambulatory Visit: Payer: Self-pay | Admitting: Internal Medicine

## 2019-11-08 ENCOUNTER — Other Ambulatory Visit: Admission: RE | Admit: 2019-11-08 | Payer: Managed Care, Other (non HMO) | Source: Ambulatory Visit

## 2019-11-09 ENCOUNTER — Telehealth: Payer: Self-pay

## 2019-11-09 NOTE — Telephone Encounter (Signed)
Patients colonoscopy has been canceled due to the death of her grandmother.  Offered condolence, and told her she could call back at a later time to reschedule her colonoscopy.  Thanks,  [place], [place]

## 2019-11-10 ENCOUNTER — Ambulatory Visit
Admission: RE | Admit: 2019-11-10 | Payer: Managed Care, Other (non HMO) | Source: Home / Self Care | Admitting: Gastroenterology

## 2019-11-10 ENCOUNTER — Encounter: Admission: RE | Payer: Self-pay | Source: Home / Self Care

## 2019-11-10 SURGERY — COLONOSCOPY WITH PROPOFOL
Anesthesia: General

## 2019-12-20 ENCOUNTER — Other Ambulatory Visit: Payer: Self-pay

## 2019-12-21 ENCOUNTER — Telehealth (INDEPENDENT_AMBULATORY_CARE_PROVIDER_SITE_OTHER): Payer: Self-pay | Admitting: Gastroenterology

## 2019-12-21 ENCOUNTER — Other Ambulatory Visit: Payer: Self-pay

## 2019-12-21 DIAGNOSIS — Z1211 Encounter for screening for malignant neoplasm of colon: Secondary | ICD-10-CM

## 2019-12-21 NOTE — Progress Notes (Signed)
Gastroenterology Pre-Procedure Review  Request Date: 01/12/20 Requesting Physician: Dr. Marius Ditch  PATIENT REVIEW QUESTIONS: The patient responded to the following health history questions as indicated:    1. Are you having any GI issues? no 2. Do you have a personal history of Polyps? no 3. Do you have a family history of Colon Cancer or Polyps? no 4. Diabetes Mellitus? no 5. Joint replacements in the past 12 months?no 6. Major health problems in the past 3 months?no 7. Any artificial heart valves, MVP, or defibrillator?no    MEDICATIONS & ALLERGIES:    Patient reports the following regarding taking any anticoagulation/antiplatelet therapy:   Plavix, Coumadin, Eliquis, Xarelto, Lovenox, Pradaxa, Brilinta, or Effient? no Aspirin? no  Patient confirms/reports the following medications:  Current Outpatient Medications  Medication Sig Dispense Refill  . Cholecalciferol (VITAMIN D3) 1000 units CAPS Take by mouth daily.    Marland Kitchen levothyroxine (SYNTHROID, LEVOTHROID) 100 MCG tablet Take by mouth.    . sertraline (ZOLOFT) 100 MG tablet Take 1 tablet (100 mg total) by mouth daily. 90 tablet 1  . SUPREP BOWEL PREP KIT 17.5-3.13-1.6 GM/177ML SOLN SMARTSIG:354 Milliliter(s) By Mouth Once    . azelastine (ASTELIN) 0.1 % nasal spray Place into the nose.    . conjugated estrogens (PREMARIN) vaginal cream Place 1 Applicatorful vaginally 2 (two) times a week. (Patient not taking: Reported on 12/21/2019) 42.5 g 12  . hydrOXYzine (ATARAX/VISTARIL) 10 MG tablet Take 1 tablet (10 mg total) by mouth daily as needed. (Patient not taking: Reported on 12/21/2019) 20 tablet 0  . vitamin B-12 (CYANOCOBALAMIN) 1000 MCG tablet Take 1,000 mcg by mouth daily.     No current facility-administered medications for this visit.    Patient confirms/reports the following allergies:  Allergies  Allergen Reactions  . Penicillins Rash    As an infant  . Sulfa Antibiotics Rash    No orders of the defined types were placed in  this encounter.   AUTHORIZATION INFORMATION Primary Insurance: 1D#: Group #:  Secondary Insurance: 1D#: Group #:  SCHEDULE INFORMATION: Date: Friday 01/12/20 Time: Location:[facility]

## 2020-01-10 ENCOUNTER — Other Ambulatory Visit: Payer: Self-pay

## 2020-01-10 ENCOUNTER — Other Ambulatory Visit
Admission: RE | Admit: 2020-01-10 | Discharge: 2020-01-10 | Disposition: A | Discharge status: Home / Self Care | Payer: Managed Care, Other (non HMO) | Source: Ambulatory Visit | Attending: Gastroenterology | Admitting: Gastroenterology

## 2020-01-10 DIAGNOSIS — Z20822 Contact with and (suspected) exposure to covid-19: Secondary | ICD-10-CM | POA: Insufficient documentation

## 2020-01-10 DIAGNOSIS — Z01812 Encounter for preprocedural laboratory examination: Secondary | ICD-10-CM | POA: Diagnosis not present

## 2020-01-11 LAB — SARS CORONAVIRUS 2 (TAT 6-24 HRS): SARS Coronavirus 2: NEGATIVE

## 2020-01-12 ENCOUNTER — Encounter
Admission: RE | Disposition: A | Discharge status: Home / Self Care | Payer: Self-pay | Source: Home / Self Care | Attending: Gastroenterology

## 2020-01-12 ENCOUNTER — Encounter: Payer: Self-pay | Admitting: Gastroenterology

## 2020-01-12 ENCOUNTER — Ambulatory Visit: Payer: Managed Care, Other (non HMO) | Admitting: Anesthesiology

## 2020-01-12 ENCOUNTER — Ambulatory Visit
Admission: RE | Admit: 2020-01-12 | Discharge: 2020-01-12 | Disposition: A | Discharge status: Home / Self Care | Payer: Managed Care, Other (non HMO) | Attending: Gastroenterology | Admitting: Gastroenterology

## 2020-01-12 ENCOUNTER — Other Ambulatory Visit: Payer: Self-pay

## 2020-01-12 DIAGNOSIS — Z8349 Family history of other endocrine, nutritional and metabolic diseases: Secondary | ICD-10-CM | POA: Insufficient documentation

## 2020-01-12 DIAGNOSIS — Z800 Family history of malignant neoplasm of digestive organs: Secondary | ICD-10-CM | POA: Insufficient documentation

## 2020-01-12 DIAGNOSIS — Z803 Family history of malignant neoplasm of breast: Secondary | ICD-10-CM | POA: Insufficient documentation

## 2020-01-12 DIAGNOSIS — Z8249 Family history of ischemic heart disease and other diseases of the circulatory system: Secondary | ICD-10-CM | POA: Diagnosis not present

## 2020-01-12 DIAGNOSIS — Z820 Family history of epilepsy and other diseases of the nervous system: Secondary | ICD-10-CM | POA: Diagnosis not present

## 2020-01-12 DIAGNOSIS — Z882 Allergy status to sulfonamides status: Secondary | ICD-10-CM | POA: Insufficient documentation

## 2020-01-12 DIAGNOSIS — D123 Benign neoplasm of transverse colon: Secondary | ICD-10-CM | POA: Insufficient documentation

## 2020-01-12 DIAGNOSIS — E039 Hypothyroidism, unspecified: Secondary | ICD-10-CM | POA: Insufficient documentation

## 2020-01-12 DIAGNOSIS — Z880 Allergy status to penicillin: Secondary | ICD-10-CM | POA: Diagnosis not present

## 2020-01-12 DIAGNOSIS — F419 Anxiety disorder, unspecified: Secondary | ICD-10-CM | POA: Diagnosis not present

## 2020-01-12 DIAGNOSIS — K5730 Diverticulosis of large intestine without perforation or abscess without bleeding: Secondary | ICD-10-CM | POA: Insufficient documentation

## 2020-01-12 DIAGNOSIS — K635 Polyp of colon: Secondary | ICD-10-CM | POA: Diagnosis not present

## 2020-01-12 DIAGNOSIS — Z801 Family history of malignant neoplasm of trachea, bronchus and lung: Secondary | ICD-10-CM | POA: Diagnosis not present

## 2020-01-12 DIAGNOSIS — Z808 Family history of malignant neoplasm of other organs or systems: Secondary | ICD-10-CM | POA: Insufficient documentation

## 2020-01-12 DIAGNOSIS — Z79899 Other long term (current) drug therapy: Secondary | ICD-10-CM | POA: Insufficient documentation

## 2020-01-12 DIAGNOSIS — Z1211 Encounter for screening for malignant neoplasm of colon: Secondary | ICD-10-CM

## 2020-01-12 HISTORY — PX: COLONOSCOPY WITH PROPOFOL: SHX5780

## 2020-01-12 HISTORY — DX: Hypothyroidism, unspecified: E03.9

## 2020-01-12 SURGERY — COLONOSCOPY WITH PROPOFOL
Anesthesia: General

## 2020-01-12 MED ORDER — PROPOFOL 10 MG/ML IV BOLUS
INTRAVENOUS | Status: DC | PRN
  Administered 2020-01-12: 80 mg via INTRAVENOUS
  Administered 2020-01-12 (×2): 40 mg via INTRAVENOUS

## 2020-01-12 MED ORDER — SODIUM CHLORIDE 0.9 % IV SOLN: INTRAVENOUS | Status: DC

## 2020-01-12 MED ORDER — PROPOFOL 500 MG/50ML IV EMUL
INTRAVENOUS | Status: DC | PRN
  Administered 2020-01-12: 140 ug/kg/min via INTRAVENOUS

## 2020-01-12 MED ORDER — LIDOCAINE HCL (CARDIAC) PF 100 MG/5ML IV SOSY
PREFILLED_SYRINGE | INTRAVENOUS | Status: DC | PRN
  Administered 2020-01-12: 100 mg via INTRAVENOUS

## 2020-01-12 NOTE — H&P (Signed)
Cephas Darby, MD [address]  [place]  [place], [place] [address]  Main: [phone]  Fax: [phone] Pager: [phone]  Primary Care Physician:  Jearld Fenton, NP Primary Gastroenterologist:  Dr. Cephas Darby  Pre-Procedure History & Physical: HPI:  Gloria Li is a 44 y.o. female is here for an colonoscopy.   Past Medical History:  Diagnosis Date  . Anxiety   . BRCA negative 04/2017   MyRisk neg  . Bursitis   . Family history of breast cancer   . Genetic testing of female    BRCA negative  . Hypothyroidism   . Increased risk of breast cancer 04/2017   IBIS=23.65/riskscore=22%  . Premature ovarian failure   . Thyroid disease     Past Surgical History:  Procedure Laterality Date  . BREAST SURGERY Bilateral 10/2012   Mother had breast cancer masectomy was for prophylactically. later in 2014 she had breast implants.   . CARPAL TUNNEL RELEASE Right 08/19/2018  . CESAREAN SECTION  2002  . WISDOM TOOTH EXTRACTION      Prior to Admission medications   Medication Sig Start Date End Date Taking? Authorizing Provider  Cholecalciferol (VITAMIN D3) 1000 units CAPS Take by mouth daily.   Yes [provider]  levothyroxine (SYNTHROID, LEVOTHROID) 100 MCG tablet Take by mouth. 01/01/16  Yes [provider]  [place] KIT 17.5-3.13-1.6 GM/177ML SOLN SMARTSIG:354 Milliliter(s) By Mouth Once 09/12/19  Yes [provider]  vitamin B-12 (CYANOCOBALAMIN) 1000 MCG tablet Take 1,000 mcg by mouth daily.   Yes [provider]  azelastine (ASTELIN) 0.1 % nasal spray Place into the nose. 07/01/18   [provider]  conjugated estrogens (PREMARIN) vaginal cream Place 1 Applicatorful vaginally 2 (two) times a week. Patient not taking: Reported on 12/21/2019 04/20/19   Jearld Fenton, NP  hydrOXYzine (ATARAX/VISTARIL) 10 MG tablet Take 1 tablet (10 mg total) by mouth daily as needed. Patient not taking: Reported on  12/21/2019 08/30/19   Jearld Fenton, NP  sertraline (ZOLOFT) 100 MG tablet Take 1 tablet (100 mg total) by mouth daily. 09/21/19   Jearld Fenton, NP    Allergies as of 12/21/2019 - Review Complete 12/21/2019  Allergen Reaction Noted  . Penicillins Rash 06/22/2012  . Sulfa antibiotics Rash 06/22/2012    Family History  Problem Relation Age of Onset  . Bone cancer Mother 55  . Breast cancer Mother 56       second dx age 76  . Lung cancer Mother 39  . Glaucoma Father   . Colon cancer Cousin 54  . Hypertension Maternal Grandmother   . Heart failure Maternal Grandfather     Social History   Socioeconomic History  . Marital status: Married    Spouse name: Not on file  . Number of children: 1  . Years of education: Not on file  . Highest education level: Not on file  Occupational History  . Occupation: Team leader-third party  Tobacco Use  . Smoking status: Never Smoker  . Smokeless tobacco: Never Used  Substance and Sexual Activity  . Alcohol use: Never  . Drug use: No  . Sexual activity: Yes    Partners: Male    Birth control/protection: Post-menopausal, Other-see comments    Comment: vasectomy  Other Topics Concern  . Not on file  Social History Narrative  . Not on file   Social Determinants of Health   Financial Resource Strain:   . Difficulty of Paying  Living Expenses:   Food Insecurity:   . Worried About Charity fundraiser in the Last Year:   . Arboriculturist in the Last Year:   Transportation Needs:   . Film/video editor (Medical):   Marland Kitchen Lack of Transportation (Non-Medical):   Physical Activity:   . Days of Exercise per Week:   . Minutes of Exercise per Session:   Stress:   . Feeling of Stress :   Social Connections:   . Frequency of Communication with Friends and Family:   . Frequency of Social Gatherings with Friends and Family:   . Attends Religious Services:   . Active Member of Clubs or Organizations:   . Attends Archivist  Meetings:   Marland Kitchen Marital Status:   Intimate Partner Violence:   . Fear of Current or Ex-Partner:   . Emotionally Abused:   Marland Kitchen Physically Abused:   . Sexually Abused:     Review of Systems: See HPI, otherwise negative ROS  Physical Exam: BP 132/84   Pulse 97   Temp 97.7 F (36.5 C) (Temporal)   Resp 18   Ht 5' 3"  (1.6 m)   Wt 97.5 kg   LMP 05/19/2011   SpO2 100%   BMI 38.09 kg/m  General:   Alert,  pleasant and cooperative in NAD Head:  Normocephalic and atraumatic. Neck:  Supple; no masses or thyromegaly. Lungs:  Clear throughout to auscultation.    Heart:  Regular rate and rhythm. Abdomen:  Soft, nontender and nondistended. Normal bowel sounds, without guarding, and without rebound.   Neurologic:  Alert and  oriented x4;  grossly normal neurologically.  Impression/Plan: Gloria Li is here for an colonoscopy to be performed for colon cancer screening  Risks, benefits, limitations, and alternatives regarding  colonoscopy have been reviewed with the patient.  Questions have been answered.  All parties agreeable.   Sherri Sear, MD  01/12/2020, 8:26 AM

## 2020-01-12 NOTE — Anesthesia Preprocedure Evaluation (Signed)
Anesthesia Evaluation  Patient identified by MRN, date of birth, ID band Patient awake    Reviewed: Allergy & Precautions, NPO status , Patient's Chart, lab work & pertinent test results  Airway Mallampati: III       Dental  (+) Teeth Intact   Pulmonary neg pulmonary ROS,    Pulmonary exam normal        Cardiovascular negative cardio ROS Normal cardiovascular exam     Neuro/Psych PSYCHIATRIC DISORDERS Anxiety negative neurological ROS     GI/Hepatic negative GI ROS, Neg liver ROS,   Endo/Other  Hypothyroidism   Renal/GU negative Renal ROS  negative genitourinary   Musculoskeletal negative musculoskeletal ROS (+)   Abdominal Normal abdominal exam  (+)   Peds negative pediatric ROS (+)  Hematology negative hematology ROS (+)   Anesthesia Other Findings Past Medical History: No date: Anxiety 04/2017: BRCA negative     Comment:  MyRisk neg No date: Bursitis No date: Family history of breast cancer No date: Genetic testing of female     Comment:  BRCA negative No date: Hypothyroidism 04/2017: Increased risk of breast cancer     Comment:  IBIS=23.65/riskscore=22% No date: Premature ovarian failure No date: Thyroid disease  Reproductive/Obstetrics                             Anesthesia Physical Anesthesia Plan  ASA: II  Anesthesia Plan: General   Post-op Pain Management:    Induction: Intravenous  PONV Risk Score and Plan: Propofol infusion  Airway Management Planned: Nasal Cannula  Additional Equipment:   Intra-op Plan:   Post-operative Plan:   Informed Consent: I have reviewed the patients History and Physical, chart, labs and discussed the procedure including the risks, benefits and alternatives for the proposed anesthesia with the patient or authorized representative who has indicated his/her understanding and acceptance.     Dental advisory given  Plan Discussed  with: CRNA and Surgeon  Anesthesia Plan Comments:         Anesthesia Quick Evaluation

## 2020-01-12 NOTE — Op Note (Signed)
[facility] Gastroenterology Patient Name: Gloria Li Procedure Date: 01/12/2020 7:44 AM MRN: [id number] Account #: [id number] Date of Birth: [birth year] Admit Type: Outpatient Age: 44 Room: [facility] ENDO ROOM 3 Gender: Female Note Status: Finalized Procedure:             Colonoscopy Indications:           Screening for colorectal malignant neoplasm, This is                         the patient's first colonoscopy Providers:             Lin Landsman MD, MD Referring MD:          Jearld Fenton (Referring MD) Medicines:             Monitored Anesthesia Care Complications:         No immediate complications. Estimated blood loss: None. Procedure:             Pre-Anesthesia Assessment:                        - Prior to the procedure, a History and Physical was                         performed, and patient medications and allergies were                         reviewed. The patient is competent. The risks and                         benefits of the procedure and the sedation options and                         risks were discussed with the patient. All questions                         were answered and informed consent was obtained.                         Patient identification and proposed procedure were                         verified by the physician, the nurse, the                         anesthesiologist, the anesthetist and the technician                         in the pre-procedure area in the procedure room in the                         endoscopy suite. Mental Status Examination: alert and                         oriented. Airway Examination: normal oropharyngeal                         airway and neck mobility. Respiratory Examination:  clear to auscultation. CV Examination: normal.                         Prophylactic Antibiotics: The patient does not require                         prophylactic antibiotics. Prior  Anticoagulants: The                         patient has taken no previous anticoagulant or                         antiplatelet agents. ASA Grade Assessment: II - A                         patient with mild systemic disease. After reviewing                         the risks and benefits, the patient was deemed in                         satisfactory condition to undergo the procedure. The                         anesthesia plan was to use monitored anesthesia care                         (MAC). Immediately prior to administration of                         medications, the patient was re-assessed for adequacy                         to receive sedatives. The heart rate, respiratory                         rate, oxygen saturations, blood pressure, adequacy of                         pulmonary ventilation, and response to care were                         monitored throughout the procedure. The physical                         status of the patient was re-assessed after the                         procedure.                        After obtaining informed consent, the colonoscope was                         passed under direct vision. Throughout the procedure,                         the patient's blood pressure, pulse, and oxygen  saturations were monitored continuously. The                         Colonoscope was introduced through the anus and                         advanced to the the cecum, identified by appendiceal                         orifice and ileocecal valve. The colonoscopy was                         performed without difficulty. The patient tolerated                         the procedure well. The quality of the bowel                         preparation was evaluated using the BBPS [facility] Bowel                         Preparation Scale) with scores of: Right Colon = 3,                         Transverse Colon = 3 and Left Colon = 3 (entire mucosa                          seen well with no residual staining, small fragments                         of stool or opaque liquid). The total BBPS score                         equals 9. Findings:      The perianal and digital rectal examinations were normal. Pertinent       negatives include normal sphincter tone and no palpable rectal lesions.      Three sessile polyps were found in the transverse colon. The polyps were       3 to 5 mm in size. These polyps were removed with a cold snare.       Resection and retrieval were complete.      Multiple diverticula were found in the sigmoid colon.      The retroflexed view of the distal rectum and anal verge was normal and       showed no anal or rectal abnormalities. Impression:            - Three 3 to 5 mm polyps in the transverse colon,                         removed with a cold snare. Resected and retrieved.                        - Diverticulosis in the sigmoid colon.                        - The distal rectum and anal verge are normal on  retroflexion view. Recommendation:        - Discharge patient to home (with escort).                        - Resume previous diet today.                        - Continue present medications.                        - Await pathology results.                        - Repeat colonoscopy in 5 years for surveillance. Procedure Code(s):     --- Professional ---                        [phone], Colonoscopy, flexible; with removal of                         tumor(s), polyp(s), or other lesion(s) by snare                         technique Diagnosis Code(s):     --- Professional ---                        Z12.11, Encounter for screening for malignant neoplasm                         of colon                        K63.5, Polyp of colon                        K57.30, Diverticulosis of large intestine without                         perforation or abscess without bleeding CPT copyright 2019  American Medical Association. All rights reserved. The codes documented in this report are preliminary and upon coder review may  be revised to meet current compliance requirements. Dr. Ulyess Mort Lin Landsman MD, MD 01/12/2020 8:52:23 AM This report has been signed electronically. Number of Addenda: 0 Note Initiated On: 01/12/2020 7:44 AM Scope Withdrawal Time: 0 hours 8 minutes 39 seconds  Total Procedure Duration: 0 hours 13 minutes 15 seconds  Estimated Blood Loss:  Estimated blood loss: none.      [facility]

## 2020-01-12 NOTE — Transfer of Care (Signed)
Immediate Anesthesia Transfer of Care Note  Patient: Gloria Li  Procedure(s) Performed: COLONOSCOPY WITH PROPOFOL (N/A )  Patient Location: PACU  Anesthesia Type:General  Level of Consciousness: sedated  Airway & Oxygen Therapy: Patient Spontanous Breathing and Patient connected to nasal cannula oxygen  Post-op Assessment: Report given to RN and Post -op Vital signs reviewed and stable  Post vital signs: Reviewed and stable  Last Vitals:  Vitals Value Taken Time  BP 113/75 01/12/20 0854  Temp 36.4 C 01/12/20 0854  Pulse 80 01/12/20 0855  Resp 14 01/12/20 0855  SpO2 97 % 01/12/20 0855  Vitals shown include unvalidated device data.  Last Pain:  Vitals:   01/12/20 0854  TempSrc: Temporal  PainSc: Asleep         Complications: No apparent anesthesia complications

## 2020-01-16 LAB — SURGICAL PATHOLOGY

## 2020-01-16 NOTE — Anesthesia Postprocedure Evaluation (Signed)
Anesthesia Post Note  Patient: Gloria Li  Procedure(s) Performed: COLONOSCOPY WITH PROPOFOL (N/A )  Patient location during evaluation: Endoscopy Anesthesia Type: General Level of consciousness: awake and alert and oriented Pain management: pain level controlled Vital Signs Assessment: post-procedure vital signs reviewed and stable Respiratory status: spontaneous breathing Cardiovascular status: blood pressure returned to baseline Anesthetic complications: no     Last Vitals:  Vitals:   01/12/20 0914 01/12/20 0924  BP: 121/86 118/86  Pulse: 76 75  Resp: 15 16  Temp:    SpO2: 96% 95%    Last Pain:  Vitals:   01/12/20 0924  TempSrc:   PainSc: 0-No pain                 [name]

## 2020-01-17 ENCOUNTER — Encounter: Payer: Self-pay | Admitting: Gastroenterology

## 2020-08-07 ENCOUNTER — Other Ambulatory Visit: Payer: Self-pay | Admitting: Internal Medicine

## 2020-09-17 ENCOUNTER — Encounter: Payer: Managed Care, Other (non HMO) | Admitting: Dermatology

## 2020-09-25 ENCOUNTER — Other Ambulatory Visit: Payer: Self-pay

## 2020-09-25 ENCOUNTER — Ambulatory Visit: Payer: Managed Care, Other (non HMO) | Admitting: Podiatry

## 2020-09-25 ENCOUNTER — Encounter: Payer: Self-pay | Admitting: Podiatry

## 2020-09-25 ENCOUNTER — Ambulatory Visit (INDEPENDENT_AMBULATORY_CARE_PROVIDER_SITE_OTHER): Payer: Managed Care, Other (non HMO)

## 2020-09-25 DIAGNOSIS — M778 Other enthesopathies, not elsewhere classified: Secondary | ICD-10-CM

## 2020-09-25 DIAGNOSIS — M779 Enthesopathy, unspecified: Secondary | ICD-10-CM

## 2020-09-25 MED ORDER — METHYLPREDNISOLONE 4 MG PO TBPK
ORAL_TABLET | ORAL | 0 refills | Status: DC
Start: 2020-09-25 — End: 2020-11-25

## 2020-09-25 MED ORDER — MELOXICAM 15 MG PO TABS
15.0000 mg | ORAL_TABLET | Freq: Every day | ORAL | 3 refills | Status: DC
Start: 2020-09-25 — End: 2021-09-11

## 2020-09-25 NOTE — Progress Notes (Signed)
Subjective:  Patient ID: Gloria Li, female    DOB: 1975-10-12,  MRN: [id number] HPI Chief Complaint  Patient presents with  . Foot Pain    Plantar forefoot left (2nd MPJ) - aching x few weeks, no injury, worse with walking, no treatment  . New Patient (Initial Visit)    45 y.o. female presents with the above complaint.   ROS: Denies fever chills nausea vomiting muscle aches pains calf pain back pain chest pain shortness of breath.  Past Medical History:  Diagnosis Date  . Anxiety   . BRCA negative 04/2017   MyRisk neg  . Bursitis   . Family history of breast cancer   . Genetic testing of female    BRCA negative  . Hypothyroidism   . Increased risk of breast cancer 04/2017   IBIS=23.65/riskscore=22%  . Premature ovarian failure   . Thyroid disease    Past Surgical History:  Procedure Laterality Date  . BREAST SURGERY Bilateral 10/2012   Mother had breast cancer masectomy was for prophylactically. later in 2014 she had breast implants.   . CARPAL TUNNEL RELEASE Right 08/19/2018  . CESAREAN SECTION  2002  . COLONOSCOPY WITH PROPOFOL N/A 01/12/2020   Procedure: COLONOSCOPY WITH PROPOFOL;  Surgeon: Lin Landsman, MD;  Location[facility] ENDOSCOPY;  Service: Gastroenterology;  Laterality: N/A;  . WISDOM TOOTH EXTRACTION      Current Outpatient Medications:  .  meloxicam (MOBIC) 15 MG tablet, Take 1 tablet (15 mg total) by mouth daily., Disp: 30 tablet, Rfl: 3 .  methylPREDNISolone (MEDROL DOSEPAK) 4 MG TBPK tablet, 6 day dose pack - take as directed, Disp: 21 tablet, Rfl: 0 .  azelastine (ASTELIN) 0.1 % nasal spray, Place into the nose., Disp: , Rfl:  .  Cholecalciferol (VITAMIN D3) 1000 units CAPS, Take by mouth daily., Disp: , Rfl:  .  levothyroxine (SYNTHROID, LEVOTHROID) 100 MCG tablet, Take by mouth., Disp: , Rfl:  .  sertraline (ZOLOFT) 100 MG tablet, Take 1 tablet (100 mg total) by mouth daily. MUST SCHEDULE PHYSICAL EXAM, Disp: 90 tablet, Rfl: 0 .   vitamin B-12 (CYANOCOBALAMIN) 1000 MCG tablet, Take 1,000 mcg by mouth daily., Disp: , Rfl:   Allergies  Allergen Reactions  . Penicillins Rash    As an infant  . Sulfa Antibiotics Rash   Review of Systems Objective:  There were no vitals filed for this visit.  General: Well developed, nourished, in no acute distress, alert and oriented x3   Dermatological: Skin is warm, dry and supple bilateral. Nails x 10 are well maintained; remaining integument appears unremarkable at this time. There are no open sores, no preulcerative lesions, no rash or signs of infection present.  Vascular: Dorsalis Pedis artery and Posterior Tibial artery pedal pulses are 2/4 bilateral with immedate capillary fill time. Pedal hair growth present. No varicosities and no lower extremity edema present bilateral.   Neruologic: Grossly intact via light touch bilateral. Vibratory intact via tuning fork bilateral. Protective threshold with Semmes Wienstein monofilament intact to all pedal sites bilateral. Patellar and Achilles deep tendon reflexes 2+ bilateral. No Babinski or clonus noted bilateral.   Musculoskeletal: No gross boney pedal deformities bilateral. No pain, crepitus, or limitation noted with foot and ankle range of motion bilateral. Muscular strength 5/5 in all groups tested bilateral.  Gait: Unassisted, Nonantalgic.    Radiographs:  Radiographs taken today demonstrate an osseously mature individual no stress fractures no acute findings.  Assessment & Plan:   Assessment: Capsulitis second metatarsophalangeal joint  left.  Plan: Discussed appropriate shoe gear stretching exercise ice therapy and shoe gear modifications.  Started her on methylprednisolone to be followed by meloxicam.  I will follow-up with her in 3 weeks just prior to her departure for a cruise.  May need to inject the plantar aspect with dexamethasone.     Max T. [place], [place]

## 2020-10-07 ENCOUNTER — Encounter: Payer: Managed Care, Other (non HMO) | Admitting: Internal Medicine

## 2020-10-16 ENCOUNTER — Ambulatory Visit: Payer: Managed Care, Other (non HMO) | Admitting: Podiatry

## 2020-11-15 HISTORY — PX: CARPAL TUNNEL RELEASE: SHX101

## 2020-11-25 ENCOUNTER — Ambulatory Visit (INDEPENDENT_AMBULATORY_CARE_PROVIDER_SITE_OTHER): Payer: Managed Care, Other (non HMO) | Admitting: Internal Medicine

## 2020-11-25 ENCOUNTER — Other Ambulatory Visit: Payer: Self-pay

## 2020-11-25 ENCOUNTER — Encounter: Payer: Self-pay | Admitting: Internal Medicine

## 2020-11-25 VITALS — BP 120/78 | HR 84 | Temp 98.3°F | Ht 63.5 in | Wt 218.0 lb

## 2020-11-25 DIAGNOSIS — Z1159 Encounter for screening for other viral diseases: Secondary | ICD-10-CM

## 2020-11-25 DIAGNOSIS — Z114 Encounter for screening for human immunodeficiency virus [HIV]: Secondary | ICD-10-CM | POA: Diagnosis not present

## 2020-11-25 DIAGNOSIS — E039 Hypothyroidism, unspecified: Secondary | ICD-10-CM | POA: Diagnosis not present

## 2020-11-25 DIAGNOSIS — F411 Generalized anxiety disorder: Secondary | ICD-10-CM

## 2020-11-25 DIAGNOSIS — Z0000 Encounter for general adult medical examination without abnormal findings: Secondary | ICD-10-CM

## 2020-11-25 NOTE — Assessment & Plan Note (Signed)
Stable on her current dose of Sertraline She is not interested in weaning this medication at this time Support offered

## 2020-11-25 NOTE — Assessment & Plan Note (Signed)
TSH and Free T4 today Will adjust Levothyroxine if needed based on labs 

## 2020-11-25 NOTE — Patient Instructions (Signed)
Health Maintenance, Female Adopting a healthy lifestyle and getting preventive care are important in promoting health and wellness. Ask your health care provider about:  The right schedule for you to have regular tests and exams.  Things you can do on your own to prevent diseases and keep yourself healthy. What should I know about diet, weight, and exercise? Eat a healthy diet  Eat a diet that includes plenty of vegetables, fruits, low-fat dairy products, and lean protein.  Do not eat a lot of foods that are high in solid fats, added sugars, or sodium.   Maintain a healthy weight Body mass index (BMI) is used to identify weight problems. It estimates body fat based on height and weight. Your health care provider can help determine your BMI and help you achieve or maintain a healthy weight. Get regular exercise Get regular exercise. This is one of the most important things you can do for your health. Most adults should:  Exercise for at least 150 minutes each week. The exercise should increase your heart rate and make you sweat (moderate-intensity exercise).  Do strengthening exercises at least twice a week. This is in addition to the moderate-intensity exercise.  Spend less time sitting. Even light physical activity can be beneficial. Watch cholesterol and blood lipids Have your blood tested for lipids and cholesterol at 47 years of age, then have this test every 5 years. Have your cholesterol levels checked more often if:  Your lipid or cholesterol levels are high.  You are older than 47 years of age.  You are at high risk for heart disease. What should I know about cancer screening? Depending on your health history and family history, you may need to have cancer screening at various ages. This may include screening for:  Breast cancer.  Cervical cancer.  Colorectal cancer.  Skin cancer.  Lung cancer. What should I know about heart disease, diabetes, and high blood  pressure? Blood pressure and heart disease  High blood pressure causes heart disease and increases the risk of stroke. This is more likely to develop in people who have high blood pressure readings, are of African descent, or are overweight.  Have your blood pressure checked: ? Every 3-5 years if you are 18-39 years of age. ? Every year if you are 40 years old or older. Diabetes Have regular diabetes screenings. This checks your fasting blood sugar level. Have the screening done:  Once every three years after age 40 if you are at a normal weight and have a low risk for diabetes.  More often and at a younger age if you are overweight or have a high risk for diabetes. What should I know about preventing infection? Hepatitis B If you have a higher risk for hepatitis B, you should be screened for this virus. Talk with your health care provider to find out if you are at risk for hepatitis B infection. Hepatitis C Testing is recommended for:  Everyone born from 1945 through 1965.  Anyone with known risk factors for hepatitis C. Sexually transmitted infections (STIs)  Get screened for STIs, including gonorrhea and chlamydia, if: ? You are sexually active and are younger than 47 years of age. ? You are older than 47 years of age and your health care provider tells you that you are at risk for this type of infection. ? Your sexual activity has changed since you were last screened, and you are at increased risk for chlamydia or gonorrhea. Ask your health care provider   if you are at risk.  Ask your health care provider about whether you are at high risk for HIV. Your health care provider may recommend a prescription medicine to help prevent HIV infection. If you choose to take medicine to prevent HIV, you should first get tested for HIV. You should then be tested every 3 months for as long as you are taking the medicine. Pregnancy  If you are about to stop having your period (premenopausal) and  you may become pregnant, seek counseling before you get pregnant.  Take 400 to 800 micrograms (mcg) of folic acid every day if you become pregnant.  Ask for birth control (contraception) if you want to prevent pregnancy. Osteoporosis and menopause Osteoporosis is a disease in which the bones lose minerals and strength with aging. This can result in bone fractures. If you are 65 years old or older, or if you are at risk for osteoporosis and fractures, ask your health care provider if you should:  Be screened for bone loss.  Take a calcium or vitamin D supplement to lower your risk of fractures.  Be given hormone replacement therapy (HRT) to treat symptoms of menopause. Follow these instructions at home: Lifestyle  Do not use any products that contain nicotine or tobacco, such as cigarettes, e-cigarettes, and chewing tobacco. If you need help quitting, ask your health care provider.  Do not use street drugs.  Do not share needles.  Ask your health care provider for help if you need support or information about quitting drugs. Alcohol use  Do not drink alcohol if: ? Your health care provider tells you not to drink. ? You are pregnant, may be pregnant, or are planning to become pregnant.  If you drink alcohol: ? Limit how much you use to 0-1 drink a day. ? Limit intake if you are breastfeeding.  Be aware of how much alcohol is in your drink. In the [place] one drink equals one 12 oz bottle of beer (355 mL), one 5 oz glass of wine (148 mL), or one 1 oz glass of hard liquor (44 mL). General instructions  Schedule regular health, dental, and eye exams.  Stay current with your vaccines.  Tell your health care provider if: ? You often feel depressed. ? You have ever been abused or do not feel safe at home. Summary  Adopting a healthy lifestyle and getting preventive care are important in promoting health and wellness.  Follow your health care provider's instructions about healthy  diet, exercising, and getting tested or screened for diseases.  Follow your health care provider's instructions on monitoring your cholesterol and blood pressure. This information is not intended to replace advice given to you by your health care provider. Make sure you discuss any questions you have with your health care provider. Document Revised: 07/27/2018 Document Reviewed: 07/27/2018 Elsevier Patient Education  2021 [employer]  

## 2020-11-25 NOTE — Progress Notes (Signed)
Subjective:    Patient ID: Gloria Li, female    DOB: 11-Jun-1976, 45 y.o.   MRN: [id number]  HPI  Pt presents to the clinic today for her annual exam. She is also due to follow up chronic conditions.  GAD: Persistent, managed on Sertraline. She is not currently seeing a therapist. She denies depression, SI/HI.  Hypothyroidism: She denies any issues on her current dose of Levothyroxine. She does not follow with endocrinology.   Flu: never Tetanus: 07/2018 Covid: Pfizer x 2 Pap Smear: 04/2017 Mammogram: double mastectomy Colon Screening: 12/2019 Vision Screening: annually Dentist: biannually   Diet: She does eat meat. She consumes fruits and veggies daily. She drinks mostly water. Exercise: Walking  Review of Systems      Past Medical History:  Diagnosis Date  . Anxiety   . BRCA negative 04/2017   MyRisk neg  . Bursitis   . Family history of breast cancer   . Genetic testing of female    BRCA negative  . Hypothyroidism   . Increased risk of breast cancer 04/2017   IBIS=23.65/riskscore=22%  . Premature ovarian failure   . Thyroid disease     Current Outpatient Medications  Medication Sig Dispense Refill  . azelastine (ASTELIN) 0.1 % nasal spray Place into the nose.    . Cholecalciferol (VITAMIN D3) 1000 units CAPS Take by mouth daily.    Marland Kitchen levothyroxine (SYNTHROID, LEVOTHROID) 100 MCG tablet Take by mouth.    . meloxicam (MOBIC) 15 MG tablet Take 1 tablet (15 mg total) by mouth daily. 30 tablet 3  . methylPREDNISolone (MEDROL DOSEPAK) 4 MG TBPK tablet 6 day dose pack - take as directed 21 tablet 0  . sertraline (ZOLOFT) 100 MG tablet Take 1 tablet (100 mg total) by mouth daily. MUST SCHEDULE PHYSICAL EXAM 90 tablet 0  . vitamin B-12 (CYANOCOBALAMIN) 1000 MCG tablet Take 1,000 mcg by mouth daily.     No current facility-administered medications for this visit.    Allergies  Allergen Reactions  . Penicillins Rash    As an infant  . Sulfa Antibiotics  Rash    Family History  Problem Relation Age of Onset  . Bone cancer Mother 21  . Breast cancer Mother 79       second dx age 70  . Lung cancer Mother 47  . Glaucoma Father   . Colon cancer Cousin 29  . Hypertension Maternal Grandmother   . Heart failure Maternal Grandfather     Social History   Socioeconomic History  . Marital status: Married    Spouse name: Not on file  . Number of children: 1  . Years of education: Not on file  . Highest education level: Not on file  Occupational History  . Occupation: Team leader-third party  Tobacco Use  . Smoking status: Never Smoker  . Smokeless tobacco: Never Used  Vaping Use  . Vaping Use: Never used  Substance and Sexual Activity  . Alcohol use: Never  . Drug use: No  . Sexual activity: Yes    Partners: Male    Birth control/protection: Post-menopausal, Other-see comments    Comment: vasectomy  Other Topics Concern  . Not on file  Social History Narrative  . Not on file   Social Determinants of Health   Financial Resource Strain: Not on file  Food Insecurity: Not on file  Transportation Needs: Not on file  Physical Activity: Not on file  Stress: Not on file  Social Connections: Not on file  Intimate Partner Violence: Not on file     Constitutional: Denies fever, malaise, fatigue, headache or abrupt weight changes.  HEENT: Denies eye pain, eye redness, ear pain, ringing in the ears, wax buildup, runny nose, nasal congestion, bloody nose, or sore throat. Respiratory: Denies difficulty breathing, shortness of breath, cough or sputum production.   Cardiovascular: Denies chest pain, chest tightness, palpitations or swelling in the hands or feet.  Gastrointestinal: Denies abdominal pain, bloating, constipation, diarrhea or blood in the stool.  GU: Denies urgency, frequency, pain with urination, burning sensation, blood in urine, odor or discharge. Musculoskeletal: Denies decrease in range of motion, difficulty with  gait, muscle pain or joint pain and swelling.  Skin: Pt reports new skin lesion, RUE. Denies redness, rashes, or ulcercations.  Neurological: Denies dizziness, difficulty with memory, difficulty with speech or problems with balance and coordination.  Psych: Pt reports anxiety. Denies depression, SI/HI.  No other specific complaints in a complete review of systems (except as listed in HPI above).  Objective:   Physical Exam  BP 120/78   Pulse 84   Temp 98.3 F (36.8 C) (Temporal)   Ht 5' 3.5" (1.613 m)   Wt 218 lb (98.9 kg)   LMP 05/19/2011   SpO2 98%   BMI 38.01 kg/m   Wt Readings from Last 3 Encounters:  01/12/20 215 lb (97.5 kg)  08/30/19 218 lb (98.9 kg)  04/20/19 219 lb (99.3 kg)    General: Appears her stated age, obese in NAD. Skin: Warm, dry and intact. 0.25 cm oval raised wart like lesion to inner right upper arm. Multiple skin tags noted of neck. HEENT: Head: normal shape and size; Eyes: sclera white, no icterus, conjunctiva pink, PERRLA and EOMs intact;  Neck:  Neck supple, trachea midline. No masses, lumps or thyromegaly present.  Cardiovascular: Normal rate and rhythm. S1,S2 noted.  No murmur, rubs or gallops noted. No JVD or BLE edema. Pulmonary/Chest: Normal effort and positive vesicular breath sounds. No respiratory distress. No wheezes, rales or ronchi noted.  Abdomen: Soft and nontender. Normal bowel sounds. No distention or masses noted. Liver, spleen and kidneys non palpable. Musculoskeletal: Strength 5/5 BUE/BLE.No difficulty with gait.  Neurological: Alert and oriented. Cranial nerves II-XII grossly intact. Coordination normal.  Psychiatric: Mood and affect normal. Behavior is normal. Judgment and thought content normal.     BMET    Component Value Date/Time   NA 141 08/30/2019 1501   K 4.3 08/30/2019 1501   CL 104 08/30/2019 1501   CO2 26 08/30/2019 1501   GLUCOSE 92 08/30/2019 1501   BUN 15 08/30/2019 1501   CREATININE 0.91 08/30/2019 1501    CREATININE 1.03 03/07/2018 0000   CALCIUM 9.8 08/30/2019 1501   GFRNONAA 76 08/30/2019 1501   GFRAA 88 08/30/2019 1501    Lipid Panel     Component Value Date/Time   CHOL 190 08/30/2019 1501   CHOL 175 03/07/2018 0000   TRIG 222 (H) 08/30/2019 1501   TRIG 128 03/07/2018 0000   TRIG 128 03/07/2018 0000   HDL 39 (L) 08/30/2019 1501   HDL 41 03/07/2018 0000   CHOLHDL 4.9 (H) 08/30/2019 1501   LDLCALC 112 (H) 08/30/2019 1501    CBC    Component Value Date/Time   WBC 8.9 08/30/2019 1501   RBC 5.07 08/30/2019 1501   HGB 14.9 08/30/2019 1501   HCT 44.5 08/30/2019 1501   PLT 256 08/30/2019 1501   MCV 88 08/30/2019 1501   MCH 29.4 08/30/2019 1501  MCHC 33.5 08/30/2019 1501   RDW 12.9 08/30/2019 1501   LYMPHSABS 1.8 12/01/2016 1131   EOSABS 0.1 12/01/2016 1131   BASOSABS 0.0 12/01/2016 1131    Hgb A1C Lab Results  Component Value Date   HGBA1C 5.5 08/30/2019            Assessment & Plan:   Preventative Health Maintenance:  She declines flu shot Tetanus UTD Encouraged her to get her Covid booster Pap smear due 2023 Does not need mammogram Colon screening UTD Encouraged her to consume a balanced diet and exercise regimen Advised her to see an eye doctor and dentist annually Will check CBC, CMET, TSH, Lipid, A1C, HIV and Hep C today  RTC in 1 year, sooner if needed Webb Silversmith, NP This visit occurred during the SARS-CoV-2 public health emergency.  Safety protocols were in place, including screening questions prior to the visit, additional usage of staff PPE, and extensive cleaning of exam room while observing appropriate contact time as indicated for disinfecting solutions.

## 2020-11-26 ENCOUNTER — Encounter: Payer: Self-pay | Admitting: Internal Medicine

## 2020-11-26 LAB — LIPID PANEL
Chol/HDL Ratio: 5.7 ratio — ABNORMAL HIGH (ref 0.0–4.4)
Cholesterol, Total: 204 mg/dL — ABNORMAL HIGH (ref 100–199)
HDL: 36 mg/dL — ABNORMAL LOW (ref >39)
LDL Chol Calc (NIH): 120 mg/dL — ABNORMAL HIGH (ref 0–99)
Triglycerides: 274 mg/dL — ABNORMAL HIGH (ref 0–149)
VLDL Cholesterol Cal: 48 mg/dL — ABNORMAL HIGH (ref 5–40)

## 2020-11-26 LAB — COMPREHENSIVE METABOLIC PANEL
ALT: 17 IU/L (ref 0–32)
AST: 16 IU/L (ref 0–40)
Albumin/Globulin Ratio: 1.8 (ref 1.2–2.2)
Albumin: 4.8 g/dL (ref 3.8–4.8)
Alkaline Phosphatase: 70 IU/L (ref 44–121)
BUN/Creatinine Ratio: 16 (ref 9–23)
BUN: 13 mg/dL (ref 6–24)
Bilirubin Total: 0.3 mg/dL (ref 0.0–1.2)
CO2: 20 mmol/L (ref 20–29)
Calcium: 9.5 mg/dL (ref 8.7–10.2)
Chloride: 105 mmol/L (ref 96–106)
Creatinine, Ser: 0.82 mg/dL (ref 0.57–1.00)
Globulin, Total: 2.7 g/dL (ref 1.5–4.5)
Glucose: 84 mg/dL (ref 65–99)
Potassium: 4.6 mmol/L (ref 3.5–5.2)
Sodium: 141 mmol/L (ref 134–144)
Total Protein: 7.5 g/dL (ref 6.0–8.5)
eGFR: 89 mL/min/{1.73_m2} (ref >59)

## 2020-11-26 LAB — HEMOGLOBIN A1C
Est. average glucose Bld gHb Est-mCnc: 114 mg/dL
Hgb A1c MFr Bld: 5.6 % (ref 4.8–5.6)

## 2020-11-26 LAB — CBC
Hematocrit: 44.3 % (ref 34.0–46.6)
Hemoglobin: 14.8 g/dL (ref 11.1–15.9)
MCH: 29.2 pg (ref 26.6–33.0)
MCHC: 33.4 g/dL (ref 31.5–35.7)
MCV: 88 fL (ref 79–97)
Platelets: 234 10*3/uL (ref 150–450)
RBC: 5.06 x10E6/uL (ref 3.77–5.28)
RDW: 13 % (ref 11.7–15.4)
WBC: 6.5 10*3/uL (ref 3.4–10.8)

## 2020-11-26 LAB — HIV ANTIBODY (ROUTINE TESTING W REFLEX): HIV Screen 4th Generation wRfx: NONREACTIVE

## 2020-11-26 LAB — HEPATITIS C ANTIBODY: Hep C Virus Ab: <0.1 s/co ratio (ref 0.0–0.9)

## 2020-11-26 LAB — TSH: TSH: 3.49 u[IU]/mL (ref 0.450–4.500)

## 2020-11-26 LAB — T4, FREE: Free T4: 1.62 ng/dL (ref 0.82–1.77)

## 2020-11-28 MED ORDER — LEVOTHYROXINE SODIUM 100 MCG PO TABS: 100.0000 ug | ORAL_TABLET | Freq: Every day | ORAL | 3 refills | Status: DC

## 2021-01-08 ENCOUNTER — Ambulatory Visit: Payer: 59 | Admitting: Internal Medicine

## 2021-09-08 ENCOUNTER — Encounter: Payer: Self-pay | Admitting: Internal Medicine

## 2021-09-11 ENCOUNTER — Other Ambulatory Visit: Payer: Self-pay

## 2021-09-11 ENCOUNTER — Ambulatory Visit (INDEPENDENT_AMBULATORY_CARE_PROVIDER_SITE_OTHER): Payer: Managed Care, Other (non HMO) | Admitting: Internal Medicine

## 2021-09-11 ENCOUNTER — Encounter: Payer: Self-pay | Admitting: Internal Medicine

## 2021-09-11 VITALS — BP 118/72 | HR 81 | Resp 17 | Ht 63.5 in | Wt 218.6 lb

## 2021-09-11 DIAGNOSIS — F40243 Fear of flying: Secondary | ICD-10-CM

## 2021-09-11 DIAGNOSIS — F411 Generalized anxiety disorder: Secondary | ICD-10-CM | POA: Diagnosis not present

## 2021-09-11 DIAGNOSIS — E039 Hypothyroidism, unspecified: Secondary | ICD-10-CM

## 2021-09-11 DIAGNOSIS — T753XXA Motion sickness, initial encounter: Secondary | ICD-10-CM

## 2021-09-11 MED ORDER — SCOPOLAMINE 1 MG/3DAYS TD PT72: 1.0000 | MEDICATED_PATCH | TRANSDERMAL | 0 refills | Status: DC

## 2021-09-11 MED ORDER — BUSPIRONE HCL 5 MG PO TABS: 5.0000 mg | ORAL_TABLET | Freq: Three times a day (TID) | ORAL | 0 refills | Status: DC | PRN

## 2021-09-11 MED ORDER — HYDROXYZINE HCL 10 MG PO TABS: 10.0000 mg | ORAL_TABLET | Freq: Three times a day (TID) | ORAL | 0 refills | Status: DC | PRN

## 2021-09-11 NOTE — Progress Notes (Signed)
Subjective:    Patient ID: Gloria Li, female    DOB: [birth year], 46 y.o.   MRN: [id number]  HPI  Patient presents to clinic today for follow-up of chronic conditions.  Hypothyroidism: She denies any issues on her current dose of Levothyroxine.  She does not follow with endocrinology.  GAD: Chronic, managed on Sertraline.  She does feel like her anxiety has been worse lately. This is situational due to family issues (her son is having relationship troubles). She reports she is sleeping well. She has poor appetite and has had crying spells. She is not currently seeing a therapist.  She denies depression, SI/HI.  She would also like a refill of Hydroxyzine and RX for Scopolamine patches for her upcoming cruise in April.  Review of Systems     Past Medical History:  Diagnosis Date   Anxiety    BRCA negative 04/2017   MyRisk neg   Bursitis    Family history of breast cancer    Genetic testing of female    BRCA negative   Hypothyroidism    Increased risk of breast cancer 04/2017   IBIS=23.65/riskscore=22%   Premature ovarian failure    Thyroid disease     Current Outpatient Medications  Medication Sig Dispense Refill   azelastine (ASTELIN) 0.1 % nasal spray Place into the nose.     Cholecalciferol (VITAMIN D3) 1000 units CAPS Take by mouth daily.     levothyroxine (SYNTHROID) 100 MCG tablet Take 1 tablet (100 mcg total) by mouth daily before breakfast. 90 tablet 3   meloxicam (MOBIC) 15 MG tablet Take 1 tablet (15 mg total) by mouth daily. 30 tablet 3   sertraline (ZOLOFT) 100 MG tablet Take 1 tablet (100 mg total) by mouth daily. MUST SCHEDULE PHYSICAL EXAM 90 tablet 0   vitamin B-12 (CYANOCOBALAMIN) 1000 MCG tablet Take 1,000 mcg by mouth daily.     No current facility-administered medications for this visit.    Allergies  Allergen Reactions   Penicillins Rash    As an infant   Sulfa Antibiotics Rash    Family History  Problem Relation Age of Onset    Bone cancer Mother 27   Breast cancer Mother 58       second dx age 64   Lung cancer Mother 44   Glaucoma Father    Colon cancer Cousin 12   Hypertension Maternal Grandmother    Heart failure Maternal Grandfather     Social History   Socioeconomic History   Marital status: Married    Spouse name: Not on file   Number of children: 1   Years of education: Not on file   Highest education level: Not on file  Occupational History   Occupation: Team leader-third party  Tobacco Use   Smoking status: Never   Smokeless tobacco: Never  Vaping Use   Vaping Use: Never used  Substance and Sexual Activity   Alcohol use: Never   Drug use: No   Sexual activity: Yes    Partners: Male    Birth control/protection: Post-menopausal, Other-see comments    Comment: vasectomy  Other Topics Concern   Not on file  Social History Narrative   Not on file   Social Determinants of Health   Financial Resource Strain: Not on file  Food Insecurity: Not on file  Transportation Needs: Not on file  Physical Activity: Not on file  Stress: Not on file  Social Connections: Not on file  Intimate Partner Violence: Not  on file     Constitutional: Denies fever, malaise, fatigue, headache or abrupt weight changes.  Respiratory: Denies difficulty breathing, shortness of breath, cough or sputum production.   Cardiovascular: Denies chest pain, chest tightness, palpitations or swelling in the hands or feet.  Neurological: Pt reports motion sickness. Denies dizziness, difficulty with memory, difficulty with speech or problems with balance and coordination.  Psych: Patient reports anxiety, fear of flying. Denies depression, SI/HI.  No other specific complaints in a complete review of systems (except as listed in HPI above).  Objective:   Physical Exam   BP 118/72 (BP Location: Left Arm, Patient Position: Sitting, Cuff Size: Normal)    Pulse 81    Resp 17    Ht 5' 3.5" (1.613 m)    Wt 218 lb 9.6 oz (99.2  kg)    LMP 05/19/2011    SpO2 99%    BMI 38.12 kg/m   Wt Readings from Last 3 Encounters:  11/25/20 218 lb (98.9 kg)  01/12/20 215 lb (97.5 kg)  08/30/19 218 lb (98.9 kg)    General: Appears her stated age, obese, in NAD. Neck:  Neck supple, trachea midline. No masses, lumps or thyromegaly present.  Cardiovascular: Normal rate and rhythm. S1,S2 noted.  No murmur, rubs or gallops noted.  Pulmonary/Chest: Normal effort and positive vesicular breath sounds. No respiratory distress. No wheezes, rales or ronchi noted.  Neurological: Alert and oriented.  Psychiatric: Mood and affect normal. Tearful. Judgment and thought content normal.    BMET    Component Value Date/Time   NA 141 11/25/2020 1114   K 4.6 11/25/2020 1114   CL 105 11/25/2020 1114   CO2 20 11/25/2020 1114   GLUCOSE 84 11/25/2020 1114   BUN 13 11/25/2020 1114   CREATININE 0.82 11/25/2020 1114   CREATININE 1.03 03/07/2018 0000   CALCIUM 9.5 11/25/2020 1114   GFRNONAA 76 08/30/2019 1501   GFRAA 88 08/30/2019 1501    Lipid Panel     Component Value Date/Time   CHOL 204 (H) 11/25/2020 1114   CHOL 175 03/07/2018 0000   TRIG 274 (H) 11/25/2020 1114   TRIG 128 03/07/2018 0000   TRIG 128 03/07/2018 0000   HDL 36 (L) 11/25/2020 1114   HDL 41 03/07/2018 0000   CHOLHDL 5.7 (H) 11/25/2020 1114   LDLCALC 120 (H) 11/25/2020 1114    CBC    Component Value Date/Time   WBC 6.5 11/25/2020 1114   RBC 5.06 11/25/2020 1114   HGB 14.8 11/25/2020 1114   HCT 44.3 11/25/2020 1114   PLT 234 11/25/2020 1114   MCV 88 11/25/2020 1114   MCH 29.2 11/25/2020 1114   MCHC 33.4 11/25/2020 1114   RDW 13.0 11/25/2020 1114   LYMPHSABS 1.8 12/01/2016 1131   EOSABS 0.1 12/01/2016 1131   BASOSABS 0.0 12/01/2016 1131    Hgb A1C Lab Results  Component Value Date   HGBA1C 5.6 11/25/2020           Assessment & Plan:    Webb Silversmith, NP This visit occurred during the SARS-CoV-2 public health emergency.  Safety protocols were  in place, including screening questions prior to the visit, additional usage of staff PPE, and extensive cleaning of exam room while observing appropriate contact time as indicated for disinfecting solutions.

## 2021-09-11 NOTE — Patient Instructions (Signed)
Managing Anxiety, Adult ?After being diagnosed with anxiety, you may be relieved to know why you have felt or behaved a certain way. You may also feel overwhelmed about the treatment ahead and what it will mean for your life. With care and support, you can manage this condition. ?How to manage lifestyle changes ?Managing stress and anxiety ?Stress is your body's reaction to life changes and events, both good and bad. Most stress will last just a few hours, but stress can be ongoing and can lead to more than just stress. Although stress can play a major role in anxiety, it is not the same as anxiety. Stress is usually caused by something external, such as a deadline, test, or competition. Stress normally passes after the triggering event has ended.  ?Anxiety is caused by something internal, such as imagining a terrible outcome or worrying that something will go wrong that will devastate you. Anxiety often does not go away even after the triggering event is over, and it can become long-term (chronic) worry. It is important to understand the differences between stress and anxiety and to manage your stress effectively so that it does not lead to an anxious response. ?Talk with your health care provider or a counselor to learn more about reducing anxiety and stress. He or she may suggest tension reduction techniques, such as: ?Music therapy. Spend time creating or listening to music that you enjoy and that inspires you. ?Mindfulness-based meditation. Practice being aware of your normal breaths while not trying to control your breathing. It can be done while sitting or walking. ?Centering prayer. This involves focusing on a word, phrase, or sacred image that means something to you and brings you peace. ?Deep breathing. To do this, expand your stomach and inhale slowly through your nose. Hold your breath for 3-5 seconds. Then exhale slowly, letting your stomach muscles relax. ?Self-talk. Learn to notice and identify  thought patterns that lead to anxiety reactions and change those patterns to thoughts that feel peaceful. ?Muscle relaxation. Taking time to tense muscles and then relax them. ?Choose a tension reduction technique that fits your lifestyle and personality. These techniques take time and practice. Set aside 5-15 minutes a day to do them. Therapists can offer counseling and training in these techniques. The training to help with anxiety may be covered by some insurance plans. ?Other things you can do to manage stress and anxiety include: ?Keeping a stress diary. This can help you learn what triggers your reaction and then learn ways to manage your response. ?Thinking about how you react to certain situations. You may not be able to control everything, but you can control your response. ?Making time for activities that help you relax and not feeling guilty about spending your time in this way. ?Doing visual imagery. This involves imagining or creating mental pictures to help you relax. ?Practicing yoga. Through yoga poses, you can lower tension and promote relaxation. ? ?Medicines ?Medicines can help ease symptoms. Medicines for anxiety include: ?Antidepressant medicines. These are usually prescribed for long-term daily control. ?Anti-anxiety medicines. These may be added in severe cases, especially when panic attacks occur. ?Medicines will be prescribed by a health care provider. When used together, medicines, psychotherapy, and tension reduction techniques may be the most effective treatment. ?Relationships ?Relationships can play a big part in helping you recover. Try to spend more time connecting with trusted friends and family members. ?Consider going to couples counseling if you have a partner, taking family education classes, or going to family   therapy. ?Therapy can help you and others better understand your condition. ?How to recognize changes in your anxiety ?Everyone responds differently to treatment for  anxiety. Recovery from anxiety happens when symptoms decrease and stop interfering with your daily activities at home or work. This may mean that you will start to: ?Have better concentration and focus. Worry will interfere less in your daily thinking. ?Sleep better. ?Be less irritable. ?Have more energy. ?Have improved memory. ?It is also important to recognize when your condition is getting worse. Contact your health care provider if your symptoms interfere with home or work and you feel like your condition is not improving. ?Follow these instructions at home: ?Activity ?Exercise. Adults should do the following: ?Exercise for at least 150 minutes each week. The exercise should increase your heart rate and make you sweat (moderate-intensity exercise). ?Strengthening exercises at least twice a week. ?Get the right amount and quality of sleep. Most adults need 7-9 hours of sleep each night. ?Lifestyle ? ?Eat a healthy diet that includes plenty of vegetables, fruits, whole grains, low-fat dairy products, and lean protein. ?Do not eat a lot of foods that are high in fats, added sugars, or salt (sodium). ?Make choices that simplify your life. ?Do not use any products that contain nicotine or tobacco. These products include cigarettes, chewing tobacco, and vaping devices, such as e-cigarettes. If you need help quitting, ask your health care provider. ?Avoid caffeine, alcohol, and certain over-the-counter cold medicines. These may make you feel worse. Ask your pharmacist which medicines to avoid. ?General instructions ?Take over-the-counter and prescription medicines only as told by your health care provider. ?Keep all follow-up visits. This is important. ?Where to find support ?You can get help and support from these sources: ?Self-help groups. ?Online and community organizations. ?A trusted spiritual leader. ?Couples counseling. ?Family education classes. ?Family therapy. ?Where to find more information ?You may find  that joining a support group helps you deal with your anxiety. The following sources can help you locate counselors or support groups near you: ?Mental Health America: www.mentalhealthamerica.net ?Anxiety and Depression Association of America (ADAA): www.adaa.org ?National Alliance on Mental Illness (NAMI): www.nami.org ?Contact a health care provider if: ?You have a hard time staying focused or finishing daily tasks. ?You spend many hours a day feeling worried about everyday life. ?You become exhausted by worry. ?You start to have headaches or frequently feel tense. ?You develop chronic nausea or diarrhea. ?Get help right away if: ?You have a racing heart and shortness of breath. ?You have thoughts of hurting yourself or others. ?If you ever feel like you may hurt yourself or others, or have thoughts about taking your own life, get help right away. Go to your nearest emergency department or: ?Call your local emergency services (911 in the [place] ?Call a suicide crisis helpline, such as the National Suicide Prevention Lifeline at 1-800-273-8255 or 988 in the [place] This is open 24 hours a day in the [place] ?Text the Crisis Text Line at 741741 (in the [place] ?Summary ?Taking steps to learn and use tension reduction techniques can help calm you and help prevent triggering an anxiety reaction. ?When used together, medicines, psychotherapy, and tension reduction techniques may be the most effective treatment. ?Family, friends, and partners can play a big part in supporting you. ?This information is not intended to replace advice given to you by your health care provider. Make sure you discuss any questions you have with your health care provider. ?Document Revised: 02/26/2021 Document Reviewed: 11/24/2020 ?Elsevier Patient   Education ? 2022 [employer]. ? ?

## 2021-09-11 NOTE — Assessment & Plan Note (Addendum)
Will check TSH and Free T4 today Will adjust Levothyroxine if needed based on labs

## 2021-09-11 NOTE — Assessment & Plan Note (Addendum)
Deteriorated GAD score of 7 Continue Sertraline at current dose Will add Buspar 5 mg TID prn Support offered

## 2021-09-12 ENCOUNTER — Telehealth: Payer: Self-pay

## 2021-09-12 NOTE — Telephone Encounter (Signed)
Copied from [place] [id number]. Topic: General - Other >> Sep 12, 2021 11:15 AM Tessa Lerner A wrote: Reason for CRM: The patient has been directed by Merideth Abbey to have additional labs done  The patient will not be getting their labs ran through [facility] and has plans to go to a [facility] site  The patient would like to know if there are any additional steps that they need to take for their lab work to be completed  Please contact further

## 2021-09-15 NOTE — Telephone Encounter (Signed)
Nothing further needed. Labs have been ordered for [facility] and released

## 2021-12-15 ENCOUNTER — Other Ambulatory Visit (HOSPITAL_COMMUNITY)
Admission: RE | Admit: 2021-12-15 | Discharge: 2021-12-15 | Disposition: A | Discharge status: Home / Self Care | Payer: Managed Care, Other (non HMO) | Source: Ambulatory Visit | Attending: Internal Medicine | Admitting: Internal Medicine

## 2021-12-15 ENCOUNTER — Encounter: Payer: Self-pay | Admitting: Internal Medicine

## 2021-12-15 ENCOUNTER — Ambulatory Visit (INDEPENDENT_AMBULATORY_CARE_PROVIDER_SITE_OTHER): Payer: Managed Care, Other (non HMO) | Admitting: Internal Medicine

## 2021-12-15 VITALS — BP 116/76 | HR 85 | Temp 96.8°F | Ht 63.0 in | Wt 219.0 lb

## 2021-12-15 DIAGNOSIS — Z124 Encounter for screening for malignant neoplasm of cervix: Secondary | ICD-10-CM | POA: Insufficient documentation

## 2021-12-15 DIAGNOSIS — Z0001 Encounter for general adult medical examination with abnormal findings: Secondary | ICD-10-CM | POA: Diagnosis not present

## 2021-12-15 DIAGNOSIS — E782 Mixed hyperlipidemia: Secondary | ICD-10-CM | POA: Insufficient documentation

## 2021-12-15 DIAGNOSIS — E039 Hypothyroidism, unspecified: Secondary | ICD-10-CM

## 2021-12-15 MED ORDER — BUSPIRONE HCL 5 MG PO TABS: 5.0000 mg | ORAL_TABLET | Freq: Three times a day (TID) | ORAL | 1 refills | Status: DC | PRN

## 2021-12-15 MED ORDER — SERTRALINE HCL 100 MG PO TABS: 100.0000 mg | ORAL_TABLET | Freq: Every day | ORAL | 1 refills | Status: DC

## 2021-12-15 MED ORDER — HYDROXYZINE HCL 10 MG PO TABS: 10.0000 mg | ORAL_TABLET | Freq: Three times a day (TID) | ORAL | 1 refills | Status: DC | PRN

## 2021-12-15 NOTE — Progress Notes (Signed)
? ?Subjective:  ? ? Patient ID: Gloria Li, female    DOB: [birth year], 46 y.o.   MRN: [id number] ? ?HPI ? ?Patient presents to clinic today for her annual exam. ? ?Flu: never ?Tetanus: 07/2018 ?COVID: Pfizer x2 ?Pap smear: 04/2017 ?Mammogram: double mastectomy ?Colon screening: 12/2019 ?Vision screening: annually ?Dentist: biannually ? ?Diet: She does eat meat. She consumes fruits and veggies. She tries to avoid fried foods. She drinks mostly water, carbonated water ?Exercise: None ? ? ?Review of Systems ? ?Past Medical History:  ?Diagnosis Date  ? Anxiety   ? BRCA negative 04/2017  ? MyRisk neg  ? Bursitis   ? Family history of breast cancer   ? Genetic testing of female   ? BRCA negative  ? Hypothyroidism   ? Increased risk of breast cancer 04/2017  ? IBIS=23.65/riskscore=22%  ? Premature ovarian failure   ? Thyroid disease   ? ? ?Current Outpatient Medications  ?Medication Sig Dispense Refill  ? azelastine (ASTELIN) 0.1 % nasal spray Place into the nose.    ? busPIRone (BUSPAR) 5 MG tablet Take 1 tablet (5 mg total) by mouth every 8 (eight) hours as needed. 90 tablet 0  ? hydrOXYzine (ATARAX) 10 MG tablet Take 1 tablet (10 mg total) by mouth 3 (three) times daily as needed. 15 tablet 0  ? levothyroxine (SYNTHROID) 100 MCG tablet Take 1 tablet (100 mcg total) by mouth daily before breakfast. 90 tablet 3  ? Probiotic Product (PROBIOTIC-10 PO) Take by mouth.    ? sertraline (ZOLOFT) 100 MG tablet Take 1 tablet (100 mg total) by mouth daily. MUST SCHEDULE PHYSICAL EXAM 90 tablet 0  ? ?No current facility-administered medications for this visit.  ? ? ?Allergies  ?Allergen Reactions  ? Penicillins Rash  ?  As an infant  ? Sulfa Antibiotics Rash  ? ? ?Family History  ?Problem Relation Age of Onset  ? Bone cancer Mother 40  ? Breast cancer Mother 49  ?     second dx age 54  ? Lung cancer Mother 44  ? Glaucoma Father   ? Colon cancer Cousin 38  ? Hypertension Maternal Grandmother   ? Heart failure Maternal  Grandfather   ? ? ?Social History  ? ?Socioeconomic History  ? Marital status: Married  ?  Spouse name: Not on file  ? Number of children: 1  ? Years of education: Not on file  ? Highest education level: Not on file  ?Occupational History  ? 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 Smoking status: Never  ? Smokeless tobacco: Never  ?Vaping Use  ? Vaping Use: Never used  ?Substance and Sexual Activity  ? Alcohol use: Never  ? Drug use: No  ? Sexual activity: Yes  ?  Partners: Male  ?  Birth control/protection: Post-menopausal, Other-see comments  ?  Comment: vasectomy  ?Other Topics Concern  ? Not on file  ?Social History Narrative  ? Not on file  ? ?Social Determinants of Health  ? ?Financial Resource Strain: Not on file  ?Food Insecurity: Not on file  ?Transportation Needs: Not on file  ?Physical Activity: Not on file  ?Stress: Not on file  ?Social Connections: Not on file  ?Intimate Partner Violence: Not on file  ? ? ? ?Constitutional: Denies fever, malaise, fatigue, headache or abrupt weight changes.  ?HEENT: Denies eye pain, eye redness, ear pain, ringing in the ears, wax buildup, runny nose, nasal congestion, bloody nose, or sore throat. ?Respiratory: Denies difficulty  breathing, shortness of breath, cough or sputum production.   ?Cardiovascular: Denies chest pain, chest tightness, palpitations or swelling in the hands or feet.  ?Gastrointestinal: Denies abdominal pain, bloating, constipation, diarrhea or blood in the stool.  ?GU: Denies urgency, frequency, pain with urination, burning sensation, blood in urine, odor or discharge. ?Musculoskeletal: Denies decrease in range of motion, difficulty with gait, muscle pain or joint pain and swelling.  ?Skin: Denies redness, rashes, lesions or ulcercations.  ?Neurological: Denies dizziness, difficulty with memory, difficulty with speech or problems with balance and coordination.  ?Psych: Patient has a history of anxiety.  Denies depression, SI/HI. ? ?No  other specific complaints in a complete review of systems (except as listed in HPI above). ? ?   ?Objective:  ? Physical Exam ? ?BP 116/76 (BP Location: Left Arm, Patient Position: Sitting, Cuff Size: Large)   Pulse 85   Temp (!) 96.8 ?F (36 ?C) (Temporal)   Ht 5' 3"  (1.6 m)   Wt 219 lb (99.3 kg)   LMP 05/19/2011   SpO2 98%   BMI 38.79 kg/m?  ? ?Wt Readings from Last 3 Encounters:  ?09/11/21 218 lb 9.6 oz (99.2 kg)  ?11/25/20 218 lb (98.9 kg)  ?01/12/20 215 lb (97.5 kg)  ? ? ?General: Appears her stated age, obese, in NAD. ?Skin: Warm, dry and intact ?HEENT: Head: normal shape and size; Eyes: sclera white, no icterus, conjunctiva pink, PERRLA and EOMs intact;  ?Neck:  Neck supple, trachea midline. No masses, lumps or thyromegaly present.  ?Cardiovascular: Normal rate and rhythm. S1,S2 noted.  No murmur, rubs or gallops noted. No JVD or BLE edema. ?Pulmonary/Chest: Normal effort and positive vesicular breath sounds. No respiratory distress. No wheezes, rales or ronchi noted.  ?Abdomen: Soft and nontender.  ?Pelvic: Normal female anatomy.  Cervix without mass or lesion.  No CMT.  Adnexa nonpalpable. ?Musculoskeletal: Strength 5/5 BUE/BLE.  No difficulty with gait.  ?Neurological: Alert and oriented. Cranial nerves II-XII grossly intact. Coordination normal.  ?Psychiatric: Mood and affect normal. Behavior is normal. Judgment and thought content normal.  ? ?BMET ?   ?Component Value Date/Time  ? NA 141 11/25/2020 1114  ? K 4.6 11/25/2020 1114  ? CL 105 11/25/2020 1114  ? CO2 20 11/25/2020 1114  ? GLUCOSE 84 11/25/2020 1114  ? BUN 13 11/25/2020 1114  ? CREATININE 0.82 11/25/2020 1114  ? CREATININE 1.03 03/07/2018 0000  ? CALCIUM 9.5 11/25/2020 1114  ? GFRNONAA 76 08/30/2019 1501  ? GFRAA 88 08/30/2019 1501  ? ? ?Lipid Panel  ?   ?Component Value Date/Time  ? CHOL 204 (H) 11/25/2020 1114  ? CHOL 175 03/07/2018 0000  ? TRIG 274 (H) 11/25/2020 1114  ? TRIG 128 03/07/2018 0000  ? TRIG 128 03/07/2018 0000  ? HDL 36 (L)  11/25/2020 1114  ? HDL 41 03/07/2018 0000  ? CHOLHDL 5.7 (H) 11/25/2020 1114  ? LDLCALC 120 (H) 11/25/2020 1114  ? ? ?CBC ?   ?Component Value Date/Time  ? WBC 6.5 11/25/2020 1114  ? RBC 5.06 11/25/2020 1114  ? HGB 14.8 11/25/2020 1114  ? HCT 44.3 11/25/2020 1114  ? PLT 234 11/25/2020 1114  ? MCV 88 11/25/2020 1114  ? MCH 29.2 11/25/2020 1114  ? MCHC 33.4 11/25/2020 1114  ? RDW 13.0 11/25/2020 1114  ? LYMPHSABS 1.8 12/01/2016 1131  ? EOSABS 0.1 12/01/2016 1131  ? BASOSABS 0.0 12/01/2016 1131  ? ? ?Hgb A1C ?Lab Results  ?Component Value Date  ? HGBA1C 5.6 11/25/2020  ? ? ? ? ? ? ?   ?  Assessment & Plan:  ? ?Preventative Health Maintenance: ? ?Encouraged her to get a flu shot in the fall ?Tetanus UTD ?Encouraged her to get her COVID booster ?Pap smear today, she declines STD screening ?He does not need mammograms, history of double mastectomy ?Colon screening UTD ?Encouraged her to consume a balanced diet and exercise regimen ?Advised her to see an eye doctor and dentist annually ?We will check CBC, c-Met, TSH, free T4, lipid, A1c ? ?RTC in 6 months, follow-up chronic conditions ?Webb Silversmith, NP ? ?

## 2021-12-15 NOTE — Patient Instructions (Signed)
Health Maintenance for Postmenopausal Women Menopause is a normal process in which your ability to get pregnant comes to an end. This process happens slowly over many months or years, usually between the ages of 48 and 55. Menopause is complete when you have missed your menstrual period for 12 months. It is important to talk with your health care provider about some of the most common conditions that affect women after menopause (postmenopausal women). These include heart disease, cancer, and bone loss (osteoporosis). Adopting a healthy lifestyle and getting preventive care can help to promote your health and wellness. The actions you take can also lower your chances of developing some of these common conditions. What are the signs and symptoms of menopause? During menopause, you may have the following symptoms: Hot flashes. These can be moderate or severe. Night sweats. Decrease in sex drive. Mood swings. Headaches. Tiredness (fatigue). Irritability. Memory problems. Problems falling asleep or staying asleep. Talk with your health care provider about treatment options for your symptoms. Do I need hormone replacement therapy? Hormone replacement therapy is effective in treating symptoms that are caused by menopause, such as hot flashes and night sweats. Hormone replacement carries certain risks, especially as you become older. If you are thinking about using estrogen or estrogen with progestin, discuss the benefits and risks with your health care provider. How can I reduce my risk for heart disease and stroke? The risk of heart disease, heart attack, and stroke increases as you age. One of the causes may be a change in the body's hormones during menopause. This can affect how your body uses dietary fats, triglycerides, and cholesterol. Heart attack and stroke are medical emergencies. There are many things that you can do to help prevent heart disease and stroke. Watch your blood pressure High  blood pressure causes heart disease and increases the risk of stroke. This is more likely to develop in people who have high blood pressure readings or are overweight. Have your blood pressure checked: Every 3-5 years if you are 18-39 years of age. Every year if you are 40 years old or older. Eat a healthy diet  Eat a diet that includes plenty of vegetables, fruits, low-fat dairy products, and lean protein. Do not eat a lot of foods that are high in solid fats, added sugars, or sodium. Get regular exercise Get regular exercise. This is one of the most important things you can do for your health. Most adults should: Try to exercise for at least 150 minutes each week. The exercise should increase your heart rate and make you sweat (moderate-intensity exercise). Try to do strengthening exercises at least twice each week. Do these in addition to the moderate-intensity exercise. Spend less time sitting. Even light physical activity can be beneficial. Other tips Work with your health care provider to achieve or maintain a healthy weight. Do not use any products that contain nicotine or tobacco. These products include cigarettes, chewing tobacco, and vaping devices, such as e-cigarettes. If you need help quitting, ask your health care provider. Know your numbers. Ask your health care provider to check your cholesterol and your blood sugar (glucose). Continue to have your blood tested as directed by your health care provider. Do I need screening for cancer? Depending on your health history and family history, you may need to have cancer screenings at different stages of your life. This may include screening for: Breast cancer. Cervical cancer. Lung cancer. Colorectal cancer. What is my risk for osteoporosis? After menopause, you may be   at increased risk for osteoporosis. Osteoporosis is a condition in which bone destruction happens more quickly than new bone creation. To help prevent osteoporosis or  the bone fractures that can happen because of osteoporosis, you may take the following actions: If you are 19-50 years old, get at least 1,000 mg of calcium and at least 600 international units (IU) of vitamin D per day. If you are older than age 50 but younger than age 70, get at least 1,200 mg of calcium and at least 600 international units (IU) of vitamin D per day. If you are older than age 70, get at least 1,200 mg of calcium and at least 800 international units (IU) of vitamin D per day. Smoking and drinking excessive alcohol increase the risk of osteoporosis. Eat foods that are rich in calcium and vitamin D, and do weight-bearing exercises several times each week as directed by your health care provider. How does menopause affect my mental health? Depression may occur at any age, but it is more common as you become older. Common symptoms of depression include: Feeling depressed. Changes in sleep patterns. Changes in appetite or eating patterns. Feeling an overall lack of motivation or enjoyment of activities that you previously enjoyed. Frequent crying spells. Talk with your health care provider if you think that you are experiencing any of these symptoms. General instructions See your health care provider for regular wellness exams and vaccines. This may include: Scheduling regular health, dental, and eye exams. Getting and maintaining your vaccines. These include: Influenza vaccine. Get this vaccine each year before the flu season begins. Pneumonia vaccine. Shingles vaccine. Tetanus, diphtheria, and pertussis (Tdap) booster vaccine. Your health care provider may also recommend other immunizations. Tell your health care provider if you have ever been abused or do not feel safe at home. Summary Menopause is a normal process in which your ability to get pregnant comes to an end. This condition causes hot flashes, night sweats, decreased interest in sex, mood swings, headaches, or lack  of sleep. Treatment for this condition may include hormone replacement therapy. Take actions to keep yourself healthy, including exercising regularly, eating a healthy diet, watching your weight, and checking your blood pressure and blood sugar levels. Get screened for cancer and depression. Make sure that you are up to date with all your vaccines. This information is not intended to replace advice given to you by your health care provider. Make sure you discuss any questions you have with your health care provider. Document Revised: 12/23/2020 Document Reviewed: 12/23/2020 Elsevier Patient Education  2023 [employer]  

## 2021-12-16 LAB — COMPLETE METABOLIC PANEL WITH GFR
AG Ratio: 1.7 (calc) (ref 1.0–2.5)
ALT: 15 U/L (ref 6–29)
AST: 14 U/L (ref 10–35)
Albumin: 4.6 g/dL (ref 3.6–5.1)
Alkaline phosphatase (APISO): 67 U/L (ref 31–125)
BUN: 18 mg/dL (ref 7–25)
CO2: 26 mmol/L (ref 20–32)
Calcium: 9.7 mg/dL (ref 8.6–10.2)
Chloride: 106 mmol/L (ref 98–110)
Creat: 0.83 mg/dL (ref 0.50–0.99)
Globulin: 2.7 g/dL (calc) (ref 1.9–3.7)
Glucose, Bld: 94 mg/dL (ref 65–99)
Potassium: 4.3 mmol/L (ref 3.5–5.3)
Sodium: 142 mmol/L (ref 135–146)
Total Bilirubin: 0.4 mg/dL (ref 0.2–1.2)
Total Protein: 7.3 g/dL (ref 6.1–8.1)
eGFR: 87 mL/min/{1.73_m2} (ref >=60)

## 2021-12-16 LAB — CBC
HCT: 44.8 % (ref 35.0–45.0)
Hemoglobin: 15 g/dL (ref 11.7–15.5)
MCH: 29.5 pg (ref 27.0–33.0)
MCHC: 33.5 g/dL (ref 32.0–36.0)
MCV: 88 fL (ref 80.0–100.0)
MPV: 10.1 fL (ref 7.5–12.5)
Platelets: 243 10*3/uL (ref 140–400)
RBC: 5.09 10*6/uL (ref 3.80–5.10)
RDW: 12.6 % (ref 11.0–15.0)
WBC: 6.5 10*3/uL (ref 3.8–10.8)

## 2021-12-16 LAB — HEMOGLOBIN A1C
Hgb A1c MFr Bld: 5.4 %{Hb}
Mean Plasma Glucose: 108 mg/dL
eAG (mmol/L): 6 mmol/L

## 2021-12-16 LAB — LIPID PANEL
Cholesterol: 184 mg/dL
HDL: 45 mg/dL — ABNORMAL LOW
LDL Cholesterol (Calc): 109 mg/dL — ABNORMAL HIGH
Non-HDL Cholesterol (Calc): 139 mg/dL — ABNORMAL HIGH
Total CHOL/HDL Ratio: 4.1 (calc)
Triglycerides: 183 mg/dL — ABNORMAL HIGH

## 2021-12-16 LAB — T4, FREE: Free T4: 1.6 ng/dL (ref 0.8–1.8)

## 2021-12-16 LAB — TSH: TSH: 3.94 mIU/L

## 2021-12-17 LAB — CYTOLOGY - PAP: Diagnosis: NEGATIVE

## 2021-12-18 ENCOUNTER — Encounter: Payer: Self-pay | Admitting: Internal Medicine

## 2021-12-18 DIAGNOSIS — E782 Mixed hyperlipidemia: Secondary | ICD-10-CM

## 2021-12-19 MED ORDER — LOVASTATIN 10 MG PO TABS: 10.0000 mg | ORAL_TABLET | Freq: Every day | ORAL | 2 refills | Status: DC

## 2021-12-29 ENCOUNTER — Other Ambulatory Visit: Payer: Self-pay | Admitting: Internal Medicine

## 2021-12-30 ENCOUNTER — Encounter: Payer: Self-pay | Admitting: Internal Medicine

## 2021-12-30 MED ORDER — LEVOTHYROXINE SODIUM 100 MCG PO TABS
100.0000 ug | ORAL_TABLET | Freq: Every day | ORAL | 1 refills | Status: DC
Start: 2021-12-30 — End: 2022-07-15

## 2021-12-30 NOTE — Telephone Encounter (Deleted)
Gloria Li,  ? ?I sent levothyroxine to your pharmacy.  Please let me know if you have trouble getting it filled.  ? ? ?Thank you,  ? ?-Mickel Baas  ? ? ?levothyroxine (SYNTHROID) 100 MCG tablet 90 tablet 1 12/30/2021    ?Sig - Route: Take 1 tablet (100 mcg total) by mouth daily before breakfast. - Oral   ?Sent to pharmacy as: levothyroxine (SYNTHROID) 100 MCG tablet   ?E-Prescribing Status: Transmission to pharmacy in progress (12/30/2021  1:06 PM EDT)   ? ?

## 2021-12-31 NOTE — Telephone Encounter (Signed)
Filled 12/30/21 #90 with 1 refill. ?

## 2022-02-02 ENCOUNTER — Encounter: Payer: Self-pay | Admitting: Internal Medicine

## 2022-02-03 ENCOUNTER — Telehealth: Payer: Managed Care, Other (non HMO) | Admitting: Internal Medicine

## 2022-02-06 ENCOUNTER — Encounter: Payer: Self-pay | Admitting: Family Medicine

## 2022-02-06 ENCOUNTER — Ambulatory Visit: Payer: Self-pay

## 2022-02-06 ENCOUNTER — Ambulatory Visit (INDEPENDENT_AMBULATORY_CARE_PROVIDER_SITE_OTHER): Payer: Managed Care, Other (non HMO) | Admitting: Family Medicine

## 2022-02-06 VITALS — BP 120/78 | HR 73 | Ht 63.0 in | Wt 218.2 lb

## 2022-02-06 DIAGNOSIS — L237 Allergic contact dermatitis due to plants, except food: Secondary | ICD-10-CM

## 2022-02-06 MED ORDER — TRIAMCINOLONE ACETONIDE 0.5 % EX CREA: 1.0000 | TOPICAL_CREAM | Freq: Two times a day (BID) | CUTANEOUS | 0 refills | Status: DC

## 2022-02-06 MED ORDER — PREDNISONE 20 MG PO TABS: ORAL_TABLET | ORAL | 0 refills | Status: DC

## 2022-03-18 ENCOUNTER — Encounter: Payer: Self-pay | Admitting: Internal Medicine

## 2022-03-18 ENCOUNTER — Ambulatory Visit (INDEPENDENT_AMBULATORY_CARE_PROVIDER_SITE_OTHER): Payer: Managed Care, Other (non HMO) | Admitting: Internal Medicine

## 2022-03-18 VITALS — BP 126/72 | HR 78 | Temp 97.3°F | Wt 223.0 lb

## 2022-03-18 DIAGNOSIS — E782 Mixed hyperlipidemia: Secondary | ICD-10-CM

## 2022-03-18 DIAGNOSIS — Z6839 Body mass index (BMI) 39.0-39.9, adult: Secondary | ICD-10-CM

## 2022-03-18 DIAGNOSIS — Z6841 Body Mass Index (BMI) 40.0 and over, adult: Secondary | ICD-10-CM | POA: Insufficient documentation

## 2022-03-18 DIAGNOSIS — E66812 Obesity, class 2: Secondary | ICD-10-CM | POA: Insufficient documentation

## 2022-03-18 DIAGNOSIS — Z0289 Encounter for other administrative examinations: Secondary | ICD-10-CM | POA: Diagnosis not present

## 2022-03-18 DIAGNOSIS — E6609 Other obesity due to excess calories: Secondary | ICD-10-CM | POA: Insufficient documentation

## 2022-03-18 NOTE — Assessment & Plan Note (Signed)
C-Met and lipid profile today Encouraged her to consume a low-fat diet Continue lovastatin, will adjust if needed based off lab

## 2022-03-18 NOTE — Assessment & Plan Note (Signed)
She is considering starting Wegovy, she will look for a pharmacy that has this in stock Encourage high-protein, low-carb, calorie restricted diet Encourage 90 minutes of exercise weekly

## 2022-03-18 NOTE — Patient Instructions (Signed)
Calorie Counting for Weight Loss Calories are units of energy. Your body needs a certain number of calories from food to keep going throughout the day. When you eat or drink more calories than your body needs, your body stores the extra calories mostly as fat. When you eat or drink fewer calories than your body needs, your body burns fat to get the energy it needs. Calorie counting means keeping track of how many calories you eat and drink each day. Calorie counting can be helpful if you need to lose weight. If you eat fewer calories than your body needs, you should lose weight. Ask your health care provider what a healthy weight is for you. For calorie counting to work, you will need to eat the right number of calories each day to lose a healthy amount of weight per week. A dietitian can help you figure out how many calories you need in a day and will suggest ways to reach your calorie goal. A healthy amount of weight to lose each week is usually 1-2 lb (0.5-0.9 kg). This usually means that your daily calorie intake should be reduced by 500-750 calories. Eating 1,200-1,500 calories a day can help most women lose weight. Eating 1,500-1,800 calories a day can help most men lose weight. What do I need to know about calorie counting? Work with your health care provider or dietitian to determine how many calories you should get each day. To meet your daily calorie goal, you will need to: Find out how many calories are in each food that you would like to eat. Try to do this before you eat. Decide how much of the food you plan to eat. Keep a food log. Do this by writing down what you ate and how many calories it had. To successfully lose weight, it is important to balance calorie counting with a healthy lifestyle that includes regular activity. Where do I find calorie information?  The number of calories in a food can be found on a Nutrition Facts label. If a food does not have a Nutrition Facts label, try  to look up the calories online or ask your dietitian for help. Remember that calories are listed per serving. If you choose to have more than one serving of a food, you will have to multiply the calories per serving by the number of servings you plan to eat. For example, the label on a package of bread might say that a serving size is 1 slice and that there are 90 calories in a serving. If you eat 1 slice, you will have eaten 90 calories. If you eat 2 slices, you will have eaten 180 calories. How do I keep a food log? After each time that you eat, record the following in your food log as soon as possible: What you ate. Be sure to include toppings, sauces, and other extras on the food. How much you ate. This can be measured in cups, ounces, or number of items. How many calories were in each food and drink. The total number of calories in the food you ate. Keep your food log near you, such as in a pocket-sized notebook or on an app or website on your mobile phone. Some programs will calculate calories for you and show you how many calories you have left to meet your daily goal. What are some portion-control tips? Know how many calories are in a serving. This will help you know how many servings you can have of a certain   food. Use a measuring cup to measure serving sizes. You could also try weighing out portions on a kitchen scale. With time, you will be able to estimate serving sizes for some foods. Take time to put servings of different foods on your favorite plates or in your favorite bowls and cups so you know what a serving looks like. Try not to eat straight from a food's packaging, such as from a bag or box. Eating straight from the package makes it hard to see how much you are eating and can lead to overeating. Put the amount you would like to eat in a cup or on a plate to make sure you are eating the right portion. Use smaller plates, glasses, and bowls for smaller portions and to prevent  overeating. Try not to multitask. For example, avoid watching TV or using your computer while eating. If it is time to eat, sit down at a table and enjoy your food. This will help you recognize when you are full. It will also help you be more mindful of what and how much you are eating. What are tips for following this plan? Reading food labels Check the calorie count compared with the serving size. The serving size may be smaller than what you are used to eating. Check the source of the calories. Try to choose foods that are high in protein, fiber, and vitamins, and low in saturated fat, trans fat, and sodium. Shopping Read nutrition labels while you shop. This will help you make healthy decisions about which foods to buy. Pay attention to nutrition labels for low-fat or fat-free foods. These foods sometimes have the same number of calories or more calories than the full-fat versions. They also often have added sugar, starch, or salt to make up for flavor that was removed with the fat. Make a grocery list of lower-calorie foods and stick to it. Cooking Try to cook your favorite foods in a healthier way. For example, try baking instead of frying. Use low-fat dairy products. Meal planning Use more fruits and vegetables. One-half of your plate should be fruits and vegetables. Include lean proteins, such as chicken, turkey, and fish. Lifestyle Each week, aim to do one of the following: 150 minutes of moderate exercise, such as walking. 75 minutes of vigorous exercise, such as running. General information Know how many calories are in the foods you eat most often. This will help you calculate calorie counts faster. Find a way of tracking calories that works for you. Get creative. Try different apps or programs if writing down calories does not work for you. What foods should I eat?  Eat nutritious foods. It is better to have a nutritious, high-calorie food, such as an avocado, than a food with  few nutrients, such as a bag of potato chips. Use your calories on foods and drinks that will fill you up and will not leave you hungry soon after eating. Examples of foods that fill you up are nuts and nut butters, vegetables, lean proteins, and high-fiber foods such as whole grains. High-fiber foods are foods with more than 5 g of fiber per serving. Pay attention to calories in drinks. Low-calorie drinks include water and unsweetened drinks. The items listed above may not be a complete list of foods and beverages you can eat. Contact a dietitian for more information. What foods should I limit? Limit foods or drinks that are not good sources of vitamins, minerals, or protein or that are high in unhealthy fats. These   include: Candy. Other sweets. Sodas, specialty coffee drinks, alcohol, and juice. The items listed above may not be a complete list of foods and beverages you should avoid. Contact a dietitian for more information. How do I count calories when eating out? Pay attention to portions. Often, portions are much larger when eating out. Try these tips to keep portions smaller: Consider sharing a meal instead of getting your own. If you get your own meal, eat only half of it. Before you start eating, ask for a container and put half of your meal into it. When available, consider ordering smaller portions from the menu instead of full portions. Pay attention to your food and drink choices. Knowing the way food is cooked and what is included with the meal can help you eat fewer calories. If calories are listed on the menu, choose the lower-calorie options. Choose dishes that include vegetables, fruits, whole grains, low-fat dairy products, and lean proteins. Choose items that are boiled, broiled, grilled, or steamed. Avoid items that are buttered, battered, fried, or served with cream sauce. Items labeled as crispy are usually fried, unless stated otherwise. Choose water, low-fat milk,  unsweetened iced tea, or other drinks without added sugar. If you want an alcoholic beverage, choose a lower-calorie option, such as a glass of wine or light beer. Ask for dressings, sauces, and syrups on the side. These are usually high in calories, so you should limit the amount you eat. If you want a salad, choose a garden salad and ask for grilled meats. Avoid extra toppings such as bacon, cheese, or fried items. Ask for the dressing on the side, or ask for olive oil and vinegar or lemon to use as dressing. Estimate how many servings of a food you are given. Knowing serving sizes will help you be aware of how much food you are eating at restaurants. Where to find more information Centers for Disease Control and Prevention: www.cdc.gov U.[place]. Department of Agriculture: myplate.gov Summary Calorie counting means keeping track of how many calories you eat and drink each day. If you eat fewer calories than your body needs, you should lose weight. A healthy amount of weight to lose per week is usually 1-2 lb (0.5-0.9 kg). This usually means reducing your daily calorie intake by 500-750 calories. The number of calories in a food can be found on a Nutrition Facts label. If a food does not have a Nutrition Facts label, try to look up the calories online or ask your dietitian for help. Use smaller plates, glasses, and bowls for smaller portions and to prevent overeating. Use your calories on foods and drinks that will fill you up and not leave you hungry shortly after a meal. This information is not intended to replace advice given to you by your health care provider. Make sure you discuss any questions you have with your health care provider. Document Revised: 09/14/2019 Document Reviewed: 09/14/2019 Elsevier Patient Education  2023 Elsevier Inc.  

## 2022-03-18 NOTE — Progress Notes (Signed)
Subjective:    Patient ID: Gloria Li, female    DOB: 06/23/1976, 46 y.o.   MRN: [id number]  HPI  Patient presents to clinic today for 86-monthfollow-up of HLD.  Her last LDL was 109, triglycerides 183, 12/2021.  She was started on Lovastatin.  She denies myalgias.  She has not been trying to consume a low-fat diet.  [place]  Her weight today is 223 pounds with a BMI of 39.5.  She is not currently adhering to any type of diet or exercise regimen.  She also has a form that she needs completed for her biometric screening for work.  Review of Systems  Past Medical History:  Diagnosis Date   Anxiety    BRCA negative 04/2017   MyRisk neg   Bursitis    Family history of breast cancer    Genetic testing of female    BRCA negative   Hypothyroidism    Increased risk of breast cancer 04/2017   IBIS=23.65/riskscore=22%   Premature ovarian failure    Thyroid disease     Current Outpatient Medications  Medication Sig Dispense Refill   azelastine (ASTELIN) 0.1 % nasal spray Place into the nose.     busPIRone (BUSPAR) 5 MG tablet Take 1 tablet (5 mg total) by mouth every 8 (eight) hours as needed. 90 tablet 1   Cyanocobalamin 1000 MCG/ML KIT Inject as directed.     hydrOXYzine (ATARAX) 10 MG tablet Take 1 tablet (10 mg total) by mouth 3 (three) times daily as needed. 90 tablet 1   levothyroxine (SYNTHROID) 100 MCG tablet Take 1 tablet (100 mcg total) by mouth daily before breakfast. 90 tablet 1   lovastatin (MEVACOR) 10 MG tablet Take 1 tablet (10 mg total) by mouth at bedtime. 30 tablet 2   predniSONE (DELTASONE) 20 MG tablet Take 2 tablets daily (438m for 4 days, take 1 tab daily (2055mfor 4 days, take half tab daily (18m68mor 4 days 14 tablet 0   Probiotic Product (PROBIOTIC-10 PO) Take by mouth.     sertraline (ZOLOFT) 100 MG tablet Take 1 tablet (100 mg total) by mouth daily. MUST SCHEDULE PHYSICAL EXAM 90 tablet 1   triamcinolone cream  (KENALOG) 0.5 % Apply 1 Application topically 2 (two) times daily. To affected areas, for up to 2 weeks. 30 g 0   No current facility-administered medications for this visit.    Allergies  Allergen Reactions   Penicillins Rash    As an infant   Sulfa Antibiotics Rash    Family History  Problem Relation Age of Onset   Bone cancer Mother 60  8reast cancer Mother 38  27   second dx age 51  108ung cancer Mother 60  76laucoma Father    Colon cancer Cousin 43  40ypertension Maternal Grandmother    Heart failure Maternal Grandfather     Social History   Socioeconomic History   Marital status: Married    Spouse name: Not on file   Number of children: 1   Years of education: Not on file   Highest education level: Not on file  Occupational History   Occupation: Team leader-third [employer]  Tobacco Use   Smoking status: Never   Smokeless tobacco: Never  Vaping Use   Vaping Use: Never used  Substance and Sexual Activity   Alcohol use: Never   Drug use: No   Sexual activity: Yes    Partners:  Male    Birth control/protection: Post-menopausal, Other-see comments    Comment: vasectomy  Other Topics Concern   Not on file  Social History Narrative   Not on file   Social Determinants of Health   Financial Resource Strain: Not on file  Food Insecurity: Not on file  Transportation Needs: Not on file  Physical Activity: Not on file  Stress: Not on file  Social Connections: Not on file  Intimate Partner Violence: Not on file     Constitutional: Denies fever, malaise, fatigue, headache or abrupt weight changes.  Respiratory: Denies difficulty breathing, shortness of breath, cough or sputum production.   Cardiovascular: Denies chest pain, chest tightness, palpitations or swelling in the hands or feet.  Gastrointestinal: Denies abdominal pain, bloating, constipation, diarrhea or blood in the stool.  Musculoskeletal: Denies decrease in range of motion, difficulty with gait, muscle  pain or joint pain and swelling.   No other specific complaints in a complete review of systems (except as listed in HPI above).     Objective:   Physical Exam  BP 126/72 (BP Location: Left Arm, Patient Position: Sitting, Cuff Size: Normal)   Pulse 78   Temp (!) 97.3 F (36.3 C) (Temporal)   Wt 223 lb (101.2 kg)   LMP 05/19/2011   SpO2 99%   BMI 39.50 kg/m   Wt Readings from Last 3 Encounters:  02/06/22 218 lb 3.2 oz (99 kg)  12/15/21 219 lb (99.3 kg)  09/11/21 218 lb 9.6 oz (99.2 kg)    General: Appears her stated age, obese, in NAD. Cardiovascular: Normal rate and rhythm. S1,S2 noted.  No murmur, rubs or gallops noted. No JVD or BLE edema. No carotid bruits noted. Pulmonary/Chest: Normal effort and positive vesicular breath sounds. No respiratory distress. No wheezes, rales or ronchi noted.  Musculoskeletal: No difficulty with gait.  Neurological: Alert and oriented.   BMET    Component Value Date/Time   NA 142 12/15/2021 0838   NA 141 11/25/2020 1114   K 4.3 12/15/2021 0838   CL 106 12/15/2021 0838   CO2 26 12/15/2021 0838   GLUCOSE 94 12/15/2021 0838   BUN 18 12/15/2021 0838   BUN 13 11/25/2020 1114   CREATININE 0.83 12/15/2021 0838   CALCIUM 9.7 12/15/2021 0838   GFRNONAA 76 08/30/2019 1501   GFRAA 88 08/30/2019 1501    Lipid Panel     Component Value Date/Time   CHOL 184 12/15/2021 0838   CHOL 204 (H) 11/25/2020 1114   CHOL 175 03/07/2018 0000   TRIG 183 (H) 12/15/2021 0838   TRIG 128 03/07/2018 0000   TRIG 128 03/07/2018 0000   HDL 45 (L) 12/15/2021 0838   HDL 36 (L) 11/25/2020 1114   HDL 41 03/07/2018 0000   CHOLHDL 4.1 12/15/2021 0838   LDLCALC 109 (H) 12/15/2021 0838    CBC    Component Value Date/Time   WBC 6.5 12/15/2021 0838   RBC 5.09 12/15/2021 0838   HGB 15.0 12/15/2021 0838   HGB 14.8 11/25/2020 1114   HCT 44.8 12/15/2021 0838   HCT 44.3 11/25/2020 1114   PLT 243 12/15/2021 0838   PLT 234 11/25/2020 1114   MCV 88.0 12/15/2021  0838   MCV 88 11/25/2020 1114   MCH 29.5 12/15/2021 0838   MCHC 33.5 12/15/2021 0838   RDW 12.6 12/15/2021 0838   RDW 13.0 11/25/2020 1114   LYMPHSABS 1.8 12/01/2016 1131   EOSABS 0.1 12/01/2016 1131   BASOSABS 0.0 12/01/2016 1131  Hgb A1C Lab Results  Component Value Date   HGBA1C 5.4 12/15/2021            Assessment & Plan:  Encounter for form completion with patient:  Biometric form screening filled out and returned patient  RTC in 3 months, follow-up chronic conditions Webb Silversmith, NP

## 2022-03-19 ENCOUNTER — Encounter: Payer: Self-pay | Admitting: Internal Medicine

## 2022-03-19 LAB — COMPLETE METABOLIC PANEL WITH GFR
AG Ratio: 1.7 (calc) (ref 1.0–2.5)
ALT: 22 U/L (ref 6–29)
AST: 17 U/L (ref 10–35)
Albumin: 4.5 g/dL (ref 3.6–5.1)
Alkaline phosphatase (APISO): 64 U/L (ref 31–125)
BUN: 19 mg/dL (ref 7–25)
CO2: 27 mmol/L (ref 20–32)
Calcium: 9.4 mg/dL (ref 8.6–10.2)
Chloride: 105 mmol/L (ref 98–110)
Creat: 0.79 mg/dL (ref 0.50–0.99)
Globulin: 2.6 g/dL (calc) (ref 1.9–3.7)
Glucose, Bld: 90 mg/dL (ref 65–99)
Potassium: 4.7 mmol/L (ref 3.5–5.3)
Sodium: 141 mmol/L (ref 135–146)
Total Bilirubin: 0.4 mg/dL (ref 0.2–1.2)
Total Protein: 7.1 g/dL (ref 6.1–8.1)
eGFR: 93 mL/min/{1.73_m2} (ref >=60)

## 2022-03-19 LAB — LIPID PANEL
Cholesterol: 184 mg/dL (ref <200)
HDL: 49 mg/dL — ABNORMAL LOW (ref >=50)
LDL Cholesterol (Calc): 103 mg/dL (calc) — ABNORMAL HIGH
Non-HDL Cholesterol (Calc): 135 mg/dL (calc) — ABNORMAL HIGH (ref <130)
Total CHOL/HDL Ratio: 3.8 (calc) (ref <5.0)
Triglycerides: 200 mg/dL — ABNORMAL HIGH (ref <150)

## 2022-03-19 MED ORDER — LOVASTATIN 20 MG PO TABS: 20.0000 mg | ORAL_TABLET | Freq: Every day | ORAL | 1 refills | Status: DC

## 2022-03-27 ENCOUNTER — Other Ambulatory Visit: Payer: Self-pay | Admitting: Internal Medicine

## 2022-03-27 NOTE — Telephone Encounter (Signed)
Requested medication (s) are due for refill today: no  Requested medication (s) are on the active medication list: no  Last refill:  03/19/22, discontinued  Future visit scheduled: yes  Notes to clinic:  Unable to refill per protocol, Rx expired.Medication was discontinued 03/19/22 by PCP, possible duplicate request.      Requested Prescriptions  Pending Prescriptions Disp Refills   lovastatin (MEVACOR) 10 MG tablet [Pharmacy Med Name: LOVASTATIN '10MG'$  TABLETS] 30 tablet 2    Sig: TAKE 1 TABLET(10 MG) BY MOUTH AT BEDTIME     Cardiovascular:  Antilipid - Statins 2 Failed - 03/27/2022  9:53 AM      Failed - Lipid Panel in normal range within the last 12 months    Cholesterol, Total  Date Value Ref Range Status  11/25/2020 204 (H) 100 - 199 mg/dL Final  03/07/2018 175  Final   Cholesterol  Date Value Ref Range Status  03/18/2022 184 <200 mg/dL Final   LDL Cholesterol (Calc)  Date Value Ref Range Status  03/18/2022 103 (H) mg/dL (calc) Final    Comment:    Reference range: <100 . Desirable range <100 mg/dL for primary prevention;   <70 mg/dL for patients with CHD or diabetic patients  with > or = 2 CHD risk factors. Marland Kitchen LDL-C is now calculated using the Martin-Hopkins  calculation, which is a validated novel method providing  better accuracy than the Friedewald equation in the  estimation of LDL-C.  Cresenciano Genre et al. Annamaria Helling. 6503;546(56): 2061-2068  (http://education.[facility].com/faq/FAQ164)    HDL Cholesterol  Date Value Ref Range Status  03/07/2018 41  Final   HDL  Date Value Ref Range Status  03/18/2022 49 (L) > OR = 50 mg/dL Final  11/25/2020 36 (L) >39 mg/dL Final   Triglycerides  Date Value Ref Range Status  03/18/2022 200 (H) <150 mg/dL Final    Comment:    . If a non-fasting specimen was collected, consider repeat triglyceride testing on a fasting specimen if clinically indicated.  Yates Decamp et al. J. of Clin. Lipidol. 8127;5:170-017. .   03/07/2018  128  Final  03/07/2018 128  Final         Passed - Cr in normal range and within 360 days    Creat  Date Value Ref Range Status  03/18/2022 0.79 0.50 - 0.99 mg/dL Final         Passed - Patient is not pregnant      Passed - Valid encounter within last 12 months    Recent Outpatient Visits           1 week ago Mixed hyperlipidemia   [place], NP   1 month ago [place], DO   3 months ago Encounter for general adult medical examination with abnormal findings   [facility] [place], Coralie Keens, NP   6 months ago Acquired hypothyroidism   [facility] [place], Coralie Keens, NP   5 years ago [place], [place]

## 2022-04-26 ENCOUNTER — Encounter: Payer: Self-pay | Admitting: Internal Medicine

## 2022-04-26 DIAGNOSIS — K5792 Diverticulitis of intestine, part unspecified, without perforation or abscess without bleeding: Secondary | ICD-10-CM

## 2022-04-27 ENCOUNTER — Encounter: Payer: Self-pay | Admitting: Internal Medicine

## 2022-04-27 MED ORDER — CIPROFLOXACIN HCL 500 MG PO TABS: 500.0000 mg | ORAL_TABLET | Freq: Two times a day (BID) | ORAL | 0 refills | Status: AC

## 2022-04-27 MED ORDER — METRONIDAZOLE 500 MG PO TABS: 500.0000 mg | ORAL_TABLET | Freq: Two times a day (BID) | ORAL | 0 refills | Status: AC

## 2022-04-28 ENCOUNTER — Encounter: Payer: Self-pay | Admitting: Internal Medicine

## 2022-05-04 ENCOUNTER — Encounter: Payer: Self-pay | Admitting: Internal Medicine

## 2022-05-04 ENCOUNTER — Ambulatory Visit (INDEPENDENT_AMBULATORY_CARE_PROVIDER_SITE_OTHER): Payer: Managed Care, Other (non HMO) | Admitting: Internal Medicine

## 2022-05-04 VITALS — BP 134/80 | HR 71 | Temp 97.7°F | Wt 219.0 lb

## 2022-05-04 DIAGNOSIS — K5792 Diverticulitis of intestine, part unspecified, without perforation or abscess without bleeding: Secondary | ICD-10-CM | POA: Diagnosis not present

## 2022-05-04 NOTE — Patient Instructions (Signed)
Diverticulitis  Diverticulitis is when small pouches in your colon (large intestine) get infected or swollen. This causes pain in the belly (abdomen) and watery poop (diarrhea). These pouches are called diverticula. The pouches form in people who have a condition called diverticulosis. What are the causes? This condition may be caused by poop (stool) that gets trapped in the pouches in your colon. The poop lets germs (bacteria) grow in the pouches. This causes the infection. What increases the risk? You are more likely to get this condition if you have small pouches in your colon. The risk is higher if: You are overweight or very overweight (obese). You do not exercise enough. You drink alcohol. You smoke or use products with tobacco in them. You eat a diet that has a lot of red meat such as beef, pork, or lamb. You eat a diet that does not have enough fiber in it. You are older than 48 years of age. What are the signs or symptoms? Pain in the belly. Pain is often on the left side, but it may be in other areas. Fever and feeling cold. Feeling like you may vomit. Vomiting. Having cramps. Feeling full. Changes to how often you poop. Blood in your poop. How is this treated? Most cases are treated at home by: Taking over-the-counter pain medicines. Following a clear liquid diet. Taking antibiotic medicines. Resting. Very bad cases may need to be treated at a hospital. This may include: Not eating or drinking. Taking prescription pain medicine. Getting antibiotic medicines through an IV tube. Getting fluid and food through an IV tube. Having surgery. When you are feeling better, your doctor may tell you to have a test to check your colon (colonoscopy). Follow these instructions at home: Medicines Take over-the-counter and prescription medicines only as told by your doctor. These include: Antibiotics. Pain medicines. Fiber pills. Probiotics. Stool softeners. If you were  prescribed an antibiotic medicine, take it as told by your doctor. Do not stop taking the antibiotic even if you start to feel better. Ask your doctor if the medicine prescribed to you requires you to avoid driving or using machinery. Eating and drinking  Follow a diet as told by your doctor. When you feel better, your doctor may tell you to change your diet. You may need to eat a lot of fiber. Fiber makes it easier to poop (have a bowel movement). Foods with fiber include: Berries. Beans. Lentils. Green vegetables. Avoid eating red meat. General instructions Do not use any products that contain nicotine or tobacco, such as cigarettes, e-cigarettes, and chewing tobacco. If you need help quitting, ask your doctor. Exercise 3 or more times a week. Try to get 30 minutes each time. Exercise enough to sweat and make your heart beat faster. Keep all follow-up visits as told by your doctor. This is important. Contact a doctor if: Your pain does not get better. You are not pooping like normal. Get help right away if: Your pain gets worse. Your symptoms do not get better. Your symptoms get worse very fast. You have a fever. You vomit more than one time. You have poop that is: Bloody. Black. Tarry. Summary This condition happens when small pouches in your colon get infected or swollen. Take medicines only as told by your doctor. Follow a diet as told by your doctor. Keep all follow-up visits as told by your doctor. This is important. This information is not intended to replace advice given to you by your health care provider. Make sure   you discuss any questions you have with your health care provider. Document Revised: 05/15/2019 Document Reviewed: 05/15/2019 Elsevier Patient Education  2023 Elsevier Inc.  

## 2022-05-04 NOTE — Progress Notes (Signed)
Subjective:    Patient ID: Gloria Li, female    DOB: 1975/09/13, 46 y.o.   MRN: [id number]  HPI  Patient presents to clinic today for ER follow-up.  She presented to the ER 9/6 with complaint of abdominal pain.  Labs showed a WBC count of 18.4.  CT abdomen/pelvis was consistent with diverticulitis.  She was given Rx for Cipro, Flagyl, Oxycodone and MiraLAX.  She was discharged and advised to follow-up with her PCP.  Since discharge, she has finished the antibiotics. She denies abdominal pain, nausea or vomiting. Her bowel movement have returned to normal. Colonoscopy from 2021 reviewed.  Review of Systems     Past Medical History:  Diagnosis Date   Anxiety    BRCA negative 04/2017   MyRisk neg   Bursitis    Family history of breast cancer    Genetic testing of female    BRCA negative   Hypothyroidism    Increased risk of breast cancer 04/2017   IBIS=23.65/riskscore=22%   Premature ovarian failure    Thyroid disease     Current Outpatient Medications  Medication Sig Dispense Refill   azelastine (ASTELIN) 0.1 % nasal spray Place into the nose.     busPIRone (BUSPAR) 5 MG tablet Take 1 tablet (5 mg total) by mouth every 8 (eight) hours as needed. 90 tablet 1   Cyanocobalamin 1000 MCG/ML KIT Inject as directed.     hydrOXYzine (ATARAX) 10 MG tablet Take 1 tablet (10 mg total) by mouth 3 (three) times daily as needed. 90 tablet 1   levothyroxine (SYNTHROID) 100 MCG tablet Take 1 tablet (100 mcg total) by mouth daily before breakfast. 90 tablet 1   lovastatin (MEVACOR) 20 MG tablet Take 1 tablet (20 mg total) by mouth at bedtime. 90 tablet 1   Probiotic Product (PROBIOTIC-10 PO) Take by mouth.     sertraline (ZOLOFT) 100 MG tablet Take 1 tablet (100 mg total) by mouth daily. MUST SCHEDULE PHYSICAL EXAM 90 tablet 1   No current facility-administered medications for this visit.    Allergies  Allergen Reactions   Penicillins Rash    As an infant   Sulfa Antibiotics  Rash    Family History  Problem Relation Age of Onset   Bone cancer Mother 80   Breast cancer Mother 27       second dx age 54   Lung cancer Mother 88   Glaucoma Father    Colon cancer Cousin 30   Hypertension Maternal Grandmother    Heart failure Maternal Grandfather     Social History   Socioeconomic History   Marital status: Married    Spouse name: Not on file   Number of children: 1   Years of education: Not on file   Highest education level: Not on file  Occupational History   Occupation: Team leader-third party  Tobacco Use   Smoking status: Never   Smokeless tobacco: Never  Vaping Use   Vaping Use: Never used  Substance and Sexual Activity   Alcohol use: Never   Drug use: No   Sexual activity: Yes    Partners: Male    Birth control/protection: Post-menopausal, Other-see comments    Comment: vasectomy  Other Topics Concern   Not on file  Social History Narrative   Not on file   Social Determinants of Health   Financial Resource Strain: Not on file  Food Insecurity: Not on file  Transportation Needs: Not on file  Physical Activity: Not on  file  Stress: Not on file  Social Connections: Not on file  Intimate Partner Violence: Not on file     Constitutional: Denies fever, malaise, fatigue, headache or abrupt weight changes.  Respiratory: Denies difficulty breathing, shortness of breath, cough or sputum production.   Cardiovascular: Denies chest pain, chest tightness, palpitations or swelling in the hands or feet.  Gastrointestinal: Denies abdominal pain, bloating, constipation, diarrhea or blood in the stool.  GU: Denies urgency, frequency, pain with urination, burning sensation, blood in urine, odor or discharge.  No other specific complaints in a complete review of systems (except as listed in HPI above).  Objective:   Physical Exam BP 134/80 (BP Location: Left Arm, Patient Position: Sitting, Cuff Size: Normal)   Pulse 71   Temp 97.7 F (36.5 C)  (Temporal)   Wt 219 lb (99.3 kg)   LMP 05/19/2011   SpO2 99%   BMI 38.79 kg/m   Wt Readings from Last 3 Encounters:  03/18/22 223 lb (101.2 kg)  02/06/22 218 lb 3.2 oz (99 kg)  12/15/21 219 lb (99.3 kg)    General: Appears her stated age,obese, in NAD. Cardiovascular: Normal rate and rhythm. S1,S2 noted.  No murmur, rubs or gallops noted.  Pulmonary/Chest: Normal effort and positive vesicular breath sounds. No respiratory distress. No wheezes, rales or ronchi noted.  Abdomen: Soft and nontender. Normal bowel sounds. No distention or masses noted.  Neurological: Alert and oriented.   BMET    Component Value Date/Time   NA 141 03/18/2022 0830   NA 141 11/25/2020 1114   K 4.7 03/18/2022 0830   CL 105 03/18/2022 0830   CO2 27 03/18/2022 0830   GLUCOSE 90 03/18/2022 0830   BUN 19 03/18/2022 0830   BUN 13 11/25/2020 1114   CREATININE 0.79 03/18/2022 0830   CALCIUM 9.4 03/18/2022 0830   GFRNONAA 76 08/30/2019 1501   GFRAA 88 08/30/2019 1501    Lipid Panel     Component Value Date/Time   CHOL 184 03/18/2022 0830   CHOL 204 (H) 11/25/2020 1114   CHOL 175 03/07/2018 0000   TRIG 200 (H) 03/18/2022 0830   TRIG 128 03/07/2018 0000   TRIG 128 03/07/2018 0000   HDL 49 (L) 03/18/2022 0830   HDL 36 (L) 11/25/2020 1114   HDL 41 03/07/2018 0000   CHOLHDL 3.8 03/18/2022 0830   LDLCALC 103 (H) 03/18/2022 0830    CBC    Component Value Date/Time   WBC 6.5 12/15/2021 0838   RBC 5.09 12/15/2021 0838   HGB 15.0 12/15/2021 0838   HGB 14.8 11/25/2020 1114   HCT 44.8 12/15/2021 0838   HCT 44.3 11/25/2020 1114   PLT 243 12/15/2021 0838   PLT 234 11/25/2020 1114   MCV 88.0 12/15/2021 0838   MCV 88 11/25/2020 1114   MCH 29.5 12/15/2021 0838   MCHC 33.5 12/15/2021 0838   RDW 12.6 12/15/2021 0838   RDW 13.0 11/25/2020 1114   LYMPHSABS 1.8 12/01/2016 1131   EOSABS 0.1 12/01/2016 1131   BASOSABS 0.0 12/01/2016 1131    Hgb A1C Lab Results  Component Value Date   HGBA1C 5.4  12/15/2021            Assessment & Plan:   ER follow-up for Diverticulitis:  ER notes, labs and imaging reviewed She has finished her course of antibiotics, symptoms have resolved Advised her if this reoccurs, start clear liquid diet  RTC in 5 months for follow-up of chronic conditions Webb Silversmith, NP

## 2022-05-13 ENCOUNTER — Encounter: Payer: Self-pay | Admitting: Internal Medicine

## 2022-05-13 MED ORDER — AMOXICILLIN-POT CLAVULANATE 875-125 MG PO TABS: 1.0000 | ORAL_TABLET | Freq: Two times a day (BID) | ORAL | 0 refills | Status: DC

## 2022-07-14 ENCOUNTER — Other Ambulatory Visit: Payer: Self-pay | Admitting: Internal Medicine

## 2022-07-15 NOTE — Telephone Encounter (Signed)
Requested Prescriptions  Pending Prescriptions Disp Refills   levothyroxine (SYNTHROID) 100 MCG tablet [Pharmacy Med Name: LEVOTHYROXINE 0.'100MG'$  (100MCG) TAB] 90 tablet 1    Sig: TAKE 1 TABLET(100 MCG) BY MOUTH DAILY BEFORE BREAKFAST     Endocrinology:  Hypothyroid Agents Passed - 07/14/2022 12:59 PM      Passed - TSH in normal range and within 360 days    TSH  Date Value Ref Range Status  12/15/2021 3.94 mIU/L Final    Comment:              Reference Range .           > or = 20 Years  0.40-4.50 .                Pregnancy Ranges           First trimester    0.26-2.66           Second trimester   0.55-2.73           Third trimester    0.43-2.91          Passed - Valid encounter within last 12 months    Recent Outpatient Visits           2 months ago Diverticulitis   [facility] [place], Coralie Keens, NP   3 months ago Mixed hyperlipidemia   [facility] [place], Coralie Keens, NP   5 months ago [place], DO   7 months ago Encounter for general adult medical examination with abnormal findings   [facility] [place], Coralie Keens, NP   10 months ago Acquired hypothyroidism   [facility] [place], Coralie Keens, [place]

## 2022-08-25 ENCOUNTER — Ambulatory Visit: Payer: Managed Care, Other (non HMO) | Admitting: Internal Medicine

## 2022-08-25 NOTE — Progress Notes (Deleted)
Subjective:    Patient ID: Gloria Li, female    DOB: Dec 10, 1975, 47 y.o.   MRN: [id number]  HPI  Patient presents to clinic today for follow-up of chronic conditions.  Hypothyroidism: She denies any issues on her current dose of levothyroxine.  She does not follow with endocrinology.  GAD: Chronic, managed on Sertraline, Buspirone and Hydroxyzine.  She is not currently seeing a therapist.  She denies depression, SI/HI.  HLD: Her last LDL was 103, triglycerides 200, 03/2022.  She denies myalgias on Lovastatin.  She tries to consume a low-fat diet.  Review of Systems     Past Medical History:  Diagnosis Date   Anxiety    BRCA negative 04/2017   MyRisk neg   Bursitis    Family history of breast cancer    Genetic testing of female    BRCA negative   Hypothyroidism    Increased risk of breast cancer 04/2017   IBIS=23.65/riskscore=22%   Premature ovarian failure    Thyroid disease     Current Outpatient Medications  Medication Sig Dispense Refill   amoxicillin-clavulanate (AUGMENTIN) 875-125 MG tablet Take 1 tablet by mouth 2 (two) times daily. 20 tablet 0   azelastine (ASTELIN) 0.1 % nasal spray Place into the nose.     busPIRone (BUSPAR) 5 MG tablet Take 1 tablet (5 mg total) by mouth every 8 (eight) hours as needed. 90 tablet 1   Cyanocobalamin 1000 MCG/ML KIT Inject as directed.     hydrOXYzine (ATARAX) 10 MG tablet Take 1 tablet (10 mg total) by mouth 3 (three) times daily as needed. 90 tablet 1   levothyroxine (SYNTHROID) 100 MCG tablet TAKE 1 TABLET(100 MCG) BY MOUTH DAILY BEFORE BREAKFAST 90 tablet 1   lovastatin (MEVACOR) 20 MG tablet Take 1 tablet (20 mg total) by mouth at bedtime. 90 tablet 1   Probiotic Product (PROBIOTIC-10 PO) Take by mouth.     sertraline (ZOLOFT) 100 MG tablet Take 1 tablet (100 mg total) by mouth daily. MUST SCHEDULE PHYSICAL EXAM 90 tablet 1   No current facility-administered medications for this visit.    Allergies  Allergen  Reactions   Penicillins Rash    As an infant   Sulfa Antibiotics Rash    Family History  Problem Relation Age of Onset   Bone cancer Mother 22   Breast cancer Mother 73       second dx age 11   Lung cancer Mother 19   Glaucoma Father    Colon cancer Cousin 49   Hypertension Maternal Grandmother    Heart failure Maternal Grandfather     Social History   Socioeconomic History   Marital status: Married    Spouse name: Not on file   Number of children: 1   Years of education: Not on file   Highest education level: Not on file  Occupational History   Occupation: Team leader-third party  Tobacco Use   Smoking status: Never   Smokeless tobacco: Never  Vaping Use   Vaping Use: Never used  Substance and Sexual Activity   Alcohol use: Never   Drug use: No   Sexual activity: Yes    Partners: Male    Birth control/protection: Post-menopausal, Other-see comments    Comment: vasectomy  Other Topics Concern   Not on file  Social History Narrative   Not on file   Social Determinants of Health   Financial Resource Strain: Not on file  Food Insecurity: Not on file  Transportation  Needs: Not on file  Physical Activity: Not on file  Stress: Not on file  Social Connections: Not on file  Intimate Partner Violence: Not on file     Constitutional: Denies fever, malaise, fatigue, headache or abrupt weight changes.  HEENT: Denies eye pain, eye redness, ear pain, ringing in the ears, wax buildup, runny nose, nasal congestion, bloody nose, or sore throat. Respiratory: Denies difficulty breathing, shortness of breath, cough or sputum production.   Cardiovascular: Denies chest pain, chest tightness, palpitations or swelling in the hands or feet.  Gastrointestinal: Denies abdominal pain, bloating, constipation, diarrhea or blood in the stool.  GU: Denies urgency, frequency, pain with urination, burning sensation, blood in urine, odor or discharge. Musculoskeletal: Denies decrease in  range of motion, difficulty with gait, muscle pain or joint pain and swelling.  Skin: Denies redness, rashes, lesions or ulcercations.  Neurological: Denies dizziness, difficulty with memory, difficulty with speech or problems with balance and coordination.  Psych: Patient has a history of anxiety.  Denies depression, SI/HI.  No other specific complaints in a complete review of systems (except as listed in HPI above).  Objective:   Physical Exam   LMP 05/19/2011  Wt Readings from Last 3 Encounters:  05/04/22 219 lb (99.3 kg)  03/18/22 223 lb (101.2 kg)  02/06/22 218 lb 3.2 oz (99 kg)    General: Appears their stated age, well developed, well nourished in NAD. Skin: Warm, dry and intact. No rashes, lesions or ulcerations noted. HEENT: Head: normal shape and size; Eyes: sclera white, no icterus, conjunctiva pink, PERRLA and EOMs intact; Ears: Tm's gray and intact, normal light reflex; Nose: mucosa pink and moist, septum midline; Throat/Mouth: Teeth present, mucosa pink and moist, no exudate, lesions or ulcerations noted.  Neck:  Neck supple, trachea midline. No masses, lumps or thyromegaly present.  Cardiovascular: Normal rate and rhythm. S1,S2 noted.  No murmur, rubs or gallops noted. No JVD or BLE edema. No carotid bruits noted. Pulmonary/Chest: Normal effort and positive vesicular breath sounds. No respiratory distress. No wheezes, rales or ronchi noted.  Abdomen: Soft and nontender. Normal bowel sounds. No distention or masses noted. Liver, spleen and kidneys non palpable. Musculoskeletal: Normal range of motion. No signs of joint swelling. No difficulty with gait.  Neurological: Alert and oriented. Cranial nerves II-XII grossly intact. Coordination normal.  Psychiatric: Mood and affect normal. Behavior is normal. Judgment and thought content normal.     BMET    Component Value Date/Time   NA 141 03/18/2022 0830   NA 141 11/25/2020 1114   K 4.7 03/18/2022 0830   CL 105  03/18/2022 0830   CO2 27 03/18/2022 0830   GLUCOSE 90 03/18/2022 0830   BUN 19 03/18/2022 0830   BUN 13 11/25/2020 1114   CREATININE 0.79 03/18/2022 0830   CALCIUM 9.4 03/18/2022 0830   GFRNONAA 76 08/30/2019 1501   GFRAA 88 08/30/2019 1501    Lipid Panel     Component Value Date/Time   CHOL 184 03/18/2022 0830   CHOL 204 (H) 11/25/2020 1114   CHOL 175 03/07/2018 0000   TRIG 200 (H) 03/18/2022 0830   TRIG 128 03/07/2018 0000   TRIG 128 03/07/2018 0000   HDL 49 (L) 03/18/2022 0830   HDL 36 (L) 11/25/2020 1114   HDL 41 03/07/2018 0000   CHOLHDL 3.8 03/18/2022 0830   LDLCALC 103 (H) 03/18/2022 0830    CBC    Component Value Date/Time   WBC 6.5 12/15/2021 0838   RBC 5.09  12/15/2021 0838   HGB 15.0 12/15/2021 0838   HGB 14.8 11/25/2020 1114   HCT 44.8 12/15/2021 0838   HCT 44.3 11/25/2020 1114   PLT 243 12/15/2021 0838   PLT 234 11/25/2020 1114   MCV 88.0 12/15/2021 0838   MCV 88 11/25/2020 1114   MCH 29.5 12/15/2021 0838   MCHC 33.5 12/15/2021 0838   RDW 12.6 12/15/2021 0838   RDW 13.0 11/25/2020 1114   LYMPHSABS 1.8 12/01/2016 1131   EOSABS 0.1 12/01/2016 1131   BASOSABS 0.0 12/01/2016 1131    Hgb A1C Lab Results  Component Value Date   HGBA1C 5.4 12/15/2021           Assessment & Plan:     RTC in 6 months for your annual exam Webb Silversmith, NP

## 2022-08-28 ENCOUNTER — Ambulatory Visit: Payer: Managed Care, Other (non HMO) | Admitting: Internal Medicine

## 2022-08-28 ENCOUNTER — Encounter: Payer: Self-pay | Admitting: Internal Medicine

## 2022-08-28 VITALS — BP 128/84 | HR 95 | Temp 96.8°F | Ht 63.0 in | Wt 221.0 lb

## 2022-08-28 DIAGNOSIS — R739 Hyperglycemia, unspecified: Secondary | ICD-10-CM

## 2022-08-28 DIAGNOSIS — E039 Hypothyroidism, unspecified: Secondary | ICD-10-CM

## 2022-08-28 DIAGNOSIS — E782 Mixed hyperlipidemia: Secondary | ICD-10-CM | POA: Diagnosis not present

## 2022-08-28 DIAGNOSIS — F411 Generalized anxiety disorder: Secondary | ICD-10-CM | POA: Diagnosis not present

## 2022-08-28 DIAGNOSIS — R7309 Other abnormal glucose: Secondary | ICD-10-CM

## 2022-08-28 DIAGNOSIS — E66812 Obesity, class 2: Secondary | ICD-10-CM

## 2022-08-28 DIAGNOSIS — Z6839 Body mass index (BMI) 39.0-39.9, adult: Secondary | ICD-10-CM

## 2022-08-28 MED ORDER — PREDNISONE 10 MG PO TABS: ORAL_TABLET | ORAL | 0 refills | Status: DC

## 2022-08-28 MED ORDER — HYDROXYZINE HCL 10 MG PO TABS: 10.0000 mg | ORAL_TABLET | Freq: Three times a day (TID) | ORAL | 1 refills | Status: DC | PRN

## 2022-08-28 MED ORDER — SERTRALINE HCL 100 MG PO TABS: 100.0000 mg | ORAL_TABLET | Freq: Every day | ORAL | 1 refills | Status: DC

## 2022-08-28 MED ORDER — BUSPIRONE HCL 5 MG PO TABS: 5.0000 mg | ORAL_TABLET | Freq: Three times a day (TID) | ORAL | 1 refills | Status: DC | PRN

## 2022-08-28 NOTE — Patient Instructions (Signed)

## 2022-08-28 NOTE — Assessment & Plan Note (Signed)
Stable on sertraline, buspirone and hydroxyzine Support offered

## 2022-08-28 NOTE — Assessment & Plan Note (Signed)
Encourage diet and exercise weight loss 

## 2022-08-28 NOTE — Assessment & Plan Note (Signed)
C-Met and lipid profile today Encouraged her consume low-fat diet Continue lovastatin

## 2022-08-28 NOTE — Assessment & Plan Note (Signed)
TSH and free T4 today We will adjust levothyroxine if needed based on labs 

## 2022-08-28 NOTE — Progress Notes (Signed)
Subjective:    Patient ID: Raiford Noble, female    DOB: 1976-01-02, 47 y.o.   MRN: [id number]  HPI  Patient presents to clinic today for follow-up of chronic conditions.  Hypothyroidism: She denies any issues on her current dose of Levothyroxine.  She does not follow with endocrinology.  Anxiety: Chronic, managed on Sertraline, Buspirone and Hydroxyzine.  She is not currently seeing a therapist.  She denies depression, SI/HI.  HLD: Her last LDL was 103, triglycerides 200, 03/2019.  She denies myalgias on Lovastatin.  She tries to consume a low-fat diet.  Review of Systems     Past Medical History:  Diagnosis Date   Anxiety    BRCA negative 04/2017   MyRisk neg   Bursitis    Family history of breast cancer    Genetic testing of female    BRCA negative   Hypothyroidism    Increased risk of breast cancer 04/2017   IBIS=23.65/riskscore=22%   Premature ovarian failure    Thyroid disease     Current Outpatient Medications  Medication Sig Dispense Refill   amoxicillin-clavulanate (AUGMENTIN) 875-125 MG tablet Take 1 tablet by mouth 2 (two) times daily. 20 tablet 0   azelastine (ASTELIN) 0.1 % nasal spray Place into the nose.     busPIRone (BUSPAR) 5 MG tablet Take 1 tablet (5 mg total) by mouth every 8 (eight) hours as needed. 90 tablet 1   Cyanocobalamin 1000 MCG/ML KIT Inject as directed.     hydrOXYzine (ATARAX) 10 MG tablet Take 1 tablet (10 mg total) by mouth 3 (three) times daily as needed. 90 tablet 1   levothyroxine (SYNTHROID) 100 MCG tablet TAKE 1 TABLET(100 MCG) BY MOUTH DAILY BEFORE BREAKFAST 90 tablet 1   lovastatin (MEVACOR) 20 MG tablet Take 1 tablet (20 mg total) by mouth at bedtime. 90 tablet 1   Probiotic Product (PROBIOTIC-10 PO) Take by mouth.     sertraline (ZOLOFT) 100 MG tablet Take 1 tablet (100 mg total) by mouth daily. MUST SCHEDULE PHYSICAL EXAM 90 tablet 1   No current facility-administered medications for this visit.    Allergies   Allergen Reactions   Penicillins Rash    As an infant   Sulfa Antibiotics Rash    Family History  Problem Relation Age of Onset   Bone cancer Mother 48   Breast cancer Mother 50       second dx age 71   Lung cancer Mother 84   Glaucoma Father    Colon cancer Cousin 20   Hypertension Maternal Grandmother    Heart failure Maternal Grandfather     Social History   Socioeconomic History   Marital status: Married    Spouse name: Not on file   Number of children: 1   Years of education: Not on file   Highest education level: Not on file  Occupational History   Occupation: Team leader-third party  Tobacco Use   Smoking status: Never   Smokeless tobacco: Never  Vaping Use   Vaping Use: Never used  Substance and Sexual Activity   Alcohol use: Never   Drug use: No   Sexual activity: Yes    Partners: Male    Birth control/protection: Post-menopausal, Other-see comments    Comment: vasectomy  Other Topics Concern   Not on file  Social History Narrative   Not on file   Social Determinants of Health   Financial Resource Strain: Not on file  Food Insecurity: Not on file  Transportation  Needs: Not on file  Physical Activity: Not on file  Stress: Not on file  Social Connections: Not on file  Intimate Partner Violence: Not on file     Constitutional: Denies fever, malaise, fatigue, headache or abrupt weight changes.  HEENT: Pt reports ear fullness. Denies eye pain, eye redness, ear pain, ringing in the ears, wax buildup, runny nose, nasal congestion, bloody nose, or sore throat. Respiratory: Denies difficulty breathing, shortness of breath, cough or sputum production.   Cardiovascular: Denies chest pain, chest tightness, palpitations or swelling in the hands or feet.  Gastrointestinal: Denies abdominal pain, bloating, constipation, diarrhea or blood in the stool.  GU: Denies urgency, frequency, pain with urination, burning sensation, blood in urine, odor or  discharge. Musculoskeletal: Denies decrease in range of motion, difficulty with gait, muscle pain or joint pain and swelling.  Skin: Denies redness, rashes, lesions or ulcercations.  Neurological: Pt reports intermittent lightheadedness. Denies difficulty with memory, difficulty with speech or problems with balance and coordination.  Psych: Patient has a history of anxiety.  Denies depression, SI/HI.  No other specific complaints in a complete review of systems (except as listed in HPI above).  Objective:   Physical Exam BP 128/84 (BP Location: Left Arm, Patient Position: Sitting, Cuff Size: Normal)   Pulse 95   Temp (!) 96.8 F (36 C) (Temporal)   Ht '5\' 3"'$  (1.6 m)   Wt 221 lb (100.2 kg)   LMP 05/19/2011   SpO2 99%   BMI 39.15 kg/m   Wt Readings from Last 3 Encounters:  05/04/22 219 lb (99.3 kg)  03/18/22 223 lb (101.2 kg)  02/06/22 218 lb 3.2 oz (99 kg)    General: Appears her stated age, obese, in NAD. Skin: Warm, dry and intact.  HEENT: Head: normal shape and size; Eyes: sclera white, no icterus, conjunctiva pink, PERRLA and EOMs intact, + serous bilaterally;  Neck:  Neck supple, trachea midline. No masses, lumps or thyromegaly present.  Cardiovascular: Normal rate and rhythm. S1,S2 noted.  No murmur, rubs or gallops noted. No JVD or BLE edema.  Pulmonary/Chest: Normal effort and positive vesicular breath sounds. No respiratory distress. No wheezes, rales or ronchi noted.  Musculoskeletal:  No difficulty with gait.  Neurological: Alert and oriented.  Psychiatric: Mood and affect normal. Behavior is normal. Judgment and thought content normal.    BMET    Component Value Date/Time   NA 141 03/18/2022 0830   NA 141 11/25/2020 1114   K 4.7 03/18/2022 0830   CL 105 03/18/2022 0830   CO2 27 03/18/2022 0830   GLUCOSE 90 03/18/2022 0830   BUN 19 03/18/2022 0830   BUN 13 11/25/2020 1114   CREATININE 0.79 03/18/2022 0830   CALCIUM 9.4 03/18/2022 0830   GFRNONAA 76  08/30/2019 1501   GFRAA 88 08/30/2019 1501    Lipid Panel     Component Value Date/Time   CHOL 184 03/18/2022 0830   CHOL 204 (H) 11/25/2020 1114   CHOL 175 03/07/2018 0000   TRIG 200 (H) 03/18/2022 0830   TRIG 128 03/07/2018 0000   TRIG 128 03/07/2018 0000   HDL 49 (L) 03/18/2022 0830   HDL 36 (L) 11/25/2020 1114   HDL 41 03/07/2018 0000   CHOLHDL 3.8 03/18/2022 0830   LDLCALC 103 (H) 03/18/2022 0830    CBC    Component Value Date/Time   WBC 6.5 12/15/2021 0838   RBC 5.09 12/15/2021 0838   HGB 15.0 12/15/2021 0838   HGB 14.8 11/25/2020 1114  HCT 44.8 12/15/2021 0838   HCT 44.3 11/25/2020 1114   PLT 243 12/15/2021 0838   PLT 234 11/25/2020 1114   MCV 88.0 12/15/2021 0838   MCV 88 11/25/2020 1114   MCH 29.5 12/15/2021 0838   MCHC 33.5 12/15/2021 0838   RDW 12.6 12/15/2021 0838   RDW 13.0 11/25/2020 1114   LYMPHSABS 1.8 12/01/2016 1131   EOSABS 0.1 12/01/2016 1131   BASOSABS 0.0 12/01/2016 1131    Hgb A1C Lab Results  Component Value Date   HGBA1C 5.4 12/15/2021            Assessment & Plan:   ETD Bilateral:  Rx for Pred taper for symptom management   RTC in 6 months for your annual exam Webb Silversmith, NP

## 2022-09-03 LAB — COMPREHENSIVE METABOLIC PANEL
ALT: 34 IU/L — ABNORMAL HIGH (ref 0–32)
AST: 18 IU/L (ref 0–40)
Albumin/Globulin Ratio: 1.9 (ref 1.2–2.2)
Albumin: 4.6 g/dL (ref 3.9–4.9)
Alkaline Phosphatase: 63 IU/L (ref 44–121)
BUN/Creatinine Ratio: 26 — ABNORMAL HIGH (ref 9–23)
BUN: 23 mg/dL (ref 6–24)
Bilirubin Total: 0.4 mg/dL (ref 0.0–1.2)
CO2: 23 mmol/L (ref 20–29)
Calcium: 9.4 mg/dL (ref 8.7–10.2)
Chloride: 103 mmol/L (ref 96–106)
Creatinine, Ser: 0.87 mg/dL (ref 0.57–1.00)
Globulin, Total: 2.4 g/dL (ref 1.5–4.5)
Glucose: 81 mg/dL (ref 70–99)
Potassium: 4.4 mmol/L (ref 3.5–5.2)
Sodium: 142 mmol/L (ref 134–144)
Total Protein: 7 g/dL (ref 6.0–8.5)
eGFR: 82 mL/min/{1.73_m2} (ref >59)

## 2022-09-03 LAB — CBC
Hematocrit: 47.9 % — ABNORMAL HIGH (ref 34.0–46.6)
Hemoglobin: 15.5 g/dL (ref 11.1–15.9)
MCH: 28.3 pg (ref 26.6–33.0)
MCHC: 32.4 g/dL (ref 31.5–35.7)
MCV: 87 fL (ref 79–97)
Platelets: 318 10*3/uL (ref 150–450)
RBC: 5.48 x10E6/uL — ABNORMAL HIGH (ref 3.77–5.28)
RDW: 13.1 % (ref 11.7–15.4)
WBC: 13.2 10*3/uL — ABNORMAL HIGH (ref 3.4–10.8)

## 2022-09-03 LAB — TSH: TSH: 4.11 u[IU]/mL (ref 0.450–4.500)

## 2022-09-03 LAB — LIPID PANEL
Chol/HDL Ratio: 3.9 ratio (ref 0.0–4.4)
Cholesterol, Total: 171 mg/dL (ref 100–199)
HDL: 44 mg/dL (ref >39)
LDL Chol Calc (NIH): 100 mg/dL — ABNORMAL HIGH (ref 0–99)
Triglycerides: 154 mg/dL — ABNORMAL HIGH (ref 0–149)
VLDL Cholesterol Cal: 27 mg/dL (ref 5–40)

## 2022-09-03 LAB — T4, FREE: Free T4: 1.69 ng/dL (ref 0.82–1.77)

## 2022-09-03 LAB — HEMOGLOBIN A1C
Est. average glucose Bld gHb Est-mCnc: 117 mg/dL
Hgb A1c MFr Bld: 5.7 % — ABNORMAL HIGH (ref 4.8–5.6)

## 2022-10-06 ENCOUNTER — Other Ambulatory Visit: Payer: Self-pay | Admitting: Internal Medicine

## 2022-10-07 NOTE — Telephone Encounter (Signed)
Requested Prescriptions  Pending Prescriptions Disp Refills   lovastatin (MEVACOR) 20 MG tablet [Pharmacy Med Name: LOVASTATIN 20MG TABLETS] 90 tablet 1    Sig: TAKE 1 TABLET(20 MG) BY MOUTH AT BEDTIME     Cardiovascular:  Antilipid - Statins 2 Failed - 10/06/2022 11:58 AM      Failed - Lipid Panel in normal range within the last 12 months    Cholesterol, Total  Date Value Ref Range Status  09/02/2022 171 100 - 199 mg/dL Final  03/07/2018 175  Final   LDL Cholesterol (Calc)  Date Value Ref Range Status  03/18/2022 103 (H) mg/dL (calc) Final    Comment:    Reference range: <100 . Desirable range <100 mg/dL for primary prevention;   <70 mg/dL for patients with CHD or diabetic patients  with > or = 2 CHD risk factors. Marland Kitchen LDL-C is now calculated using the Martin-Hopkins  calculation, which is a validated novel method providing  better accuracy than the Friedewald equation in the  estimation of LDL-C.  Cresenciano Genre et al. Annamaria Helling. [id number]): 2061-2068  (http://education.[facility].com/faq/FAQ164)    LDL Chol Calc (NIH)  Date Value Ref Range Status  09/02/2022 100 (H) 0 - 99 mg/dL Final   HDL Cholesterol  Date Value Ref Range Status  03/07/2018 41  Final   HDL  Date Value Ref Range Status  09/02/2022 44 >39 mg/dL Final   Triglycerides  Date Value Ref Range Status  09/02/2022 154 (H) 0 - 149 mg/dL Final  03/07/2018 128  Final  03/07/2018 128  Final         Passed - Cr in normal range and within 360 days    Creat  Date Value Ref Range Status  03/18/2022 0.79 0.50 - 0.99 mg/dL Final   Creatinine, Ser  Date Value Ref Range Status  09/02/2022 0.87 0.57 - 1.00 mg/dL Final         Passed - Patient is not pregnant      Passed - Valid encounter within last 12 months    Recent Outpatient Visits           1 month ago GAD (generalized anxiety disorder)   [facility] [place], Coralie Keens, NP   5 months ago Diverticulitis   [facility] [place], Coralie Keens, NP   6 months ago Mixed hyperlipidemia   [facility] [place], Coralie Keens, NP   8 months ago [place], DO   9 months ago Encounter for general adult medical examination with abnormal findings   [facility] [place], Coralie Keens, NP

## 2022-11-04 ENCOUNTER — Encounter: Payer: Self-pay | Admitting: Internal Medicine

## 2022-11-06 ENCOUNTER — Encounter: Payer: Self-pay | Admitting: Internal Medicine

## 2022-11-06 ENCOUNTER — Ambulatory Visit: Payer: Managed Care, Other (non HMO) | Admitting: Internal Medicine

## 2022-11-06 VITALS — BP 132/74 | HR 85 | Temp 95.5°F | Wt 229.0 lb

## 2022-11-06 DIAGNOSIS — K219 Gastro-esophageal reflux disease without esophagitis: Secondary | ICD-10-CM

## 2022-11-06 MED ORDER — OMEPRAZOLE 20 MG PO CPDR: 20.0000 mg | DELAYED_RELEASE_CAPSULE | Freq: Every day | ORAL | 1 refills | Status: DC

## 2022-11-06 NOTE — Patient Instructions (Signed)

## 2022-11-06 NOTE — Assessment & Plan Note (Signed)
Try to identify and avoid foods that trigger your reflux Encouraged weight loss as this can help reduce reflux symptoms RX for omeprazole 20 mg daily

## 2022-11-06 NOTE — Progress Notes (Signed)
Subjective:    Patient ID: Gloria Li, female    DOB: 12-Apr-1976, 47 y.o.   MRN: [id number]  HPI  Patient reports that she has been having acid reflux. She reports this has occurred intermittently in the past, but has been more consistent and worse in the last 3-4 weeks. It wakes her up at night. She has not been able to identify any specific foods that is trigger this. She has tried not eating after 6 pm. She She has tried drinking milk, taking Tums and Pepto Bismol. She would like a RX for Omeprazole.  Review of Systems     Past Medical History:  Diagnosis Date   Anxiety    BRCA negative 04/2017   MyRisk neg   Bursitis    Family history of breast cancer    Genetic testing of female    BRCA negative   Hypothyroidism    Increased risk of breast cancer 04/2017   IBIS=23.65/riskscore=22%   Premature ovarian failure    Thyroid disease     Current Outpatient Medications  Medication Sig Dispense Refill   azelastine (ASTELIN) 0.1 % nasal spray Place into the nose.     busPIRone (BUSPAR) 5 MG tablet Take 1 tablet (5 mg total) by mouth every 8 (eight) hours as needed. 90 tablet 1   Cyanocobalamin 1000 MCG/ML KIT Inject as directed.     hydrOXYzine (ATARAX) 10 MG tablet Take 1 tablet (10 mg total) by mouth 3 (three) times daily as needed. 90 tablet 1   levothyroxine (SYNTHROID) 100 MCG tablet TAKE 1 TABLET(100 MCG) BY MOUTH DAILY BEFORE BREAKFAST 90 tablet 1   lovastatin (MEVACOR) 20 MG tablet TAKE 1 TABLET(20 MG) BY MOUTH AT BEDTIME 90 tablet 1   predniSONE (DELTASONE) 10 MG tablet Take 6 tabs on day 1, 5 tabs on day 2, 4 tabs on day 3, 3 tabs on day 4, 2 tabs on day 5, 1 tab on day 6 21 tablet 0   Probiotic Product (PROBIOTIC-10 PO) Take by mouth.     sertraline (ZOLOFT) 100 MG tablet Take 1 tablet (100 mg total) by mouth daily. MUST SCHEDULE PHYSICAL EXAM 90 tablet 1   No current facility-administered medications for this visit.    Allergies  Allergen Reactions    Penicillins Rash    As an infant   Sulfa Antibiotics Rash    Family History  Problem Relation Age of Onset   Bone cancer Mother 63   Breast cancer Mother 9       second dx age 58   Lung cancer Mother 73   Glaucoma Father    Colon cancer Cousin 60   Hypertension Maternal Grandmother    Heart failure Maternal Grandfather     Social History   Socioeconomic History   Marital status: Married    Spouse name: Not on file   Number of children: 1   Years of education: Not on file   Highest education level: Not on file  Occupational History   Occupation: Team leader-third party  Tobacco Use   Smoking status: Never   Smokeless tobacco: Never  Vaping Use   Vaping Use: Never used  Substance and Sexual Activity   Alcohol use: Never   Drug use: No   Sexual activity: Yes    Partners: Male    Birth control/protection: Post-menopausal, Other-see comments    Comment: vasectomy  Other Topics Concern   Not on file  Social History Narrative   Not on file  Social Determinants of Health   Financial Resource Strain: Not on file  Food Insecurity: Not on file  Transportation Needs: Not on file  Physical Activity: Not on file  Stress: Not on file  Social Connections: Not on file  Intimate Partner Violence: Not on file     Constitutional: Denies fever, malaise, fatigue, headache or abrupt weight changes.  Respiratory: Denies difficulty breathing, shortness of breath, cough or sputum production.   Cardiovascular: Denies chest pain, chest tightness, palpitations or swelling in the hands or feet.  Gastrointestinal: Patient reports reflux.  Denies abdominal pain, bloating, constipation, diarrhea or blood in the stool.   No other specific complaints in a complete review of systems (except as listed in HPI above).  Objective:   Physical Exam  BP 132/74 (BP Location: Left Arm, Patient Position: Sitting, Cuff Size: Normal)   Pulse 85   Temp (!) 95.5 F (35.3 C) (Temporal)   Wt 229  lb (103.9 kg)   LMP 05/19/2011   SpO2 97%   BMI 40.57 kg/m   Wt Readings from Last 3 Encounters:  08/28/22 221 lb (100.2 kg)  05/04/22 219 lb (99.3 kg)  03/18/22 223 lb (101.2 kg)    General: Appears her stated age, obese, in NAD. Cardiovascular: Normal rate and rhythm. S1,S2 noted.  No murmur, rubs or gallops noted.  Pulmonary/Chest: Normal effort and positive vesicular breath sounds. No respiratory distress. No wheezes, rales or ronchi noted.  Abdomen: Soft and nontender. Normal bowel sounds.  Neurological: Alert and oriented.   BMET    Component Value Date/Time   NA 142 09/02/2022 0703   K 4.4 09/02/2022 0703   CL 103 09/02/2022 0703   CO2 23 09/02/2022 0703   GLUCOSE 81 09/02/2022 0703   GLUCOSE 90 03/18/2022 0830   BUN 23 09/02/2022 0703   CREATININE 0.87 09/02/2022 0703   CREATININE 0.79 03/18/2022 0830   CALCIUM 9.4 09/02/2022 0703   GFRNONAA 76 08/30/2019 1501   GFRAA 88 08/30/2019 1501    Lipid Panel     Component Value Date/Time   CHOL 171 09/02/2022 0703   CHOL 175 03/07/2018 0000   TRIG 154 (H) 09/02/2022 0703   TRIG 128 03/07/2018 0000   TRIG 128 03/07/2018 0000   HDL 44 09/02/2022 0703   HDL 41 03/07/2018 0000   CHOLHDL 3.9 09/02/2022 0703   CHOLHDL 3.8 03/18/2022 0830   LDLCALC 100 (H) 09/02/2022 0703   LDLCALC 103 (H) 03/18/2022 0830    CBC    Component Value Date/Time   WBC 13.2 (H) 09/02/2022 0703   WBC 6.5 12/15/2021 0838   RBC 5.48 (H) 09/02/2022 0703   RBC 5.09 12/15/2021 0838   HGB 15.5 09/02/2022 0703   HCT 47.9 (H) 09/02/2022 0703   PLT 318 09/02/2022 0703   MCV 87 09/02/2022 0703   MCH 28.3 09/02/2022 0703   MCH 29.5 12/15/2021 0838   MCHC 32.4 09/02/2022 0703   MCHC 33.5 12/15/2021 0838   RDW 13.1 09/02/2022 0703   LYMPHSABS 1.8 12/01/2016 1131   EOSABS 0.1 12/01/2016 1131   BASOSABS 0.0 12/01/2016 1131    Hgb A1C Lab Results  Component Value Date   HGBA1C 5.7 (H) 09/02/2022           Assessment & Plan:    RTC in 4 months for annual exam  Webb Silversmith, NP

## 2022-12-25 ENCOUNTER — Encounter: Payer: Managed Care, Other (non HMO) | Admitting: Internal Medicine

## 2023-01-01 ENCOUNTER — Ambulatory Visit (INDEPENDENT_AMBULATORY_CARE_PROVIDER_SITE_OTHER): Payer: Managed Care, Other (non HMO) | Admitting: Internal Medicine

## 2023-01-01 ENCOUNTER — Encounter: Payer: Self-pay | Admitting: Internal Medicine

## 2023-01-01 VITALS — BP 138/66 | HR 71 | Temp 96.8°F | Ht 63.0 in | Wt 227.0 lb

## 2023-01-01 DIAGNOSIS — Z0001 Encounter for general adult medical examination with abnormal findings: Secondary | ICD-10-CM

## 2023-01-01 DIAGNOSIS — E782 Mixed hyperlipidemia: Secondary | ICD-10-CM | POA: Diagnosis not present

## 2023-01-01 DIAGNOSIS — Z6841 Body Mass Index (BMI) 40.0 and over, adult: Secondary | ICD-10-CM

## 2023-01-01 DIAGNOSIS — R7309 Other abnormal glucose: Secondary | ICD-10-CM

## 2023-01-01 DIAGNOSIS — E039 Hypothyroidism, unspecified: Secondary | ICD-10-CM | POA: Diagnosis not present

## 2023-01-01 NOTE — Progress Notes (Signed)
Subjective:    Patient ID: Gloria Li, female    DOB: Feb 14, 1976, 47 y.o.   MRN: [id number]  HPI  Patient presents to clinic today for her annual exam.  Flu: Never Tetanus: 07/2018 COVID: Pfizer x 2 Pap smear: 12/2021 Mammogram: never, double mastectomy Colon screening: 12/2019 Vision screening: annually Dentist: biannually  Diet: She does eat meat. She consumes fruits and veggies. She does eat some fried foods. She drinks mostly water, coffee, zero sugar soda. Exercise: None  Review of Systems     Past Medical History:  Diagnosis Date   Anxiety    BRCA negative 04/2017   MyRisk neg   Bursitis    Family history of breast cancer    Genetic testing of female    BRCA negative   Hypothyroidism    Increased risk of breast cancer 04/2017   IBIS=23.65/riskscore=22%   Premature ovarian failure    Thyroid disease     Current Outpatient Medications  Medication Sig Dispense Refill   azelastine (ASTELIN) 0.1 % nasal spray Place into the nose.     busPIRone (BUSPAR) 5 MG tablet Take 1 tablet (5 mg total) by mouth every 8 (eight) hours as needed. 90 tablet 1   Cyanocobalamin 1000 MCG/ML KIT Inject as directed. (Patient not taking: Reported on 11/06/2022)     hydrOXYzine (ATARAX) 10 MG tablet Take 1 tablet (10 mg total) by mouth 3 (three) times daily as needed. 90 tablet 1   levothyroxine (SYNTHROID) 100 MCG tablet TAKE 1 TABLET(100 MCG) BY MOUTH DAILY BEFORE BREAKFAST 90 tablet 1   lovastatin (MEVACOR) 20 MG tablet TAKE 1 TABLET(20 MG) BY MOUTH AT BEDTIME 90 tablet 1   omeprazole (PRILOSEC) 20 MG capsule Take 1 capsule (20 mg total) by mouth daily. 90 capsule 1   predniSONE (DELTASONE) 10 MG tablet Take 6 tabs on day 1, 5 tabs on day 2, 4 tabs on day 3, 3 tabs on day 4, 2 tabs on day 5, 1 tab on day 6 21 tablet 0   Probiotic Product (PROBIOTIC-10 PO) Take by mouth.     sertraline (ZOLOFT) 100 MG tablet Take 1 tablet (100 mg total) by mouth daily. MUST SCHEDULE PHYSICAL  EXAM 90 tablet 1   No current facility-administered medications for this visit.    Allergies  Allergen Reactions   Penicillins Rash    As an infant   Sulfa Antibiotics Rash    Family History  Problem Relation Age of Onset   Bone cancer Mother 32   Breast cancer Mother 62       second dx age 34   Lung cancer Mother 38   Glaucoma Father    Colon cancer Cousin 59   Hypertension Maternal Grandmother    Heart failure Maternal Grandfather     Social History   Socioeconomic History   Marital status: Married    Spouse name: Not on file   Number of children: 1   Years of education: Not on file   Highest education level: Not on file  Occupational History   Occupation: Team leader-third party  Tobacco Use   Smoking status: Never   Smokeless tobacco: Never  Vaping Use   Vaping Use: Never used  Substance and Sexual Activity   Alcohol use: Never   Drug use: No   Sexual activity: Yes    Partners: Male    Birth control/protection: Post-menopausal, Other-see comments    Comment: vasectomy  Other Topics Concern   Not on file  Social History Narrative   Not on file   Social Determinants of Health   Financial Resource Strain: Not on file  Food Insecurity: Not on file  Transportation Needs: Not on file  Physical Activity: Not on file  Stress: Not on file  Social Connections: Not on file  Intimate Partner Violence: Not on file     Constitutional: Denies fever, malaise, fatigue, headache or abrupt weight changes.  HEENT: Denies eye pain, eye redness, ear pain, ringing in the ears, wax buildup, runny nose, nasal congestion, bloody nose, or sore throat. Respiratory: Denies difficulty breathing, shortness of breath, cough or sputum production.   Cardiovascular: Denies chest pain, chest tightness, palpitations or swelling in the hands or feet.  Gastrointestinal: Denies abdominal pain, bloating, constipation, diarrhea or blood in the stool.  GU: Denies urgency, frequency, pain  with urination, burning sensation, blood in urine, odor or discharge. Musculoskeletal: Denies decrease in range of motion, difficulty with gait, muscle pain or joint pain and swelling.  Skin: Denies redness, rashes, lesions or ulcercations.  Neurological: Denies dizziness, difficulty with memory, difficulty with speech or problems with balance and coordination.  Psych: Pt has a history of anxiety. Denies depression, SI/HI.  No other specific complaints in a complete review of systems (except as listed in HPI above).  Objective:   Physical Exam  BP 138/66 (BP Location: Left Arm, Patient Position: Sitting, Cuff Size: Large)   Pulse 71   Temp (!) 96.8 F (36 C) (Temporal)   Ht 5\' 3"  (1.6 m)   Wt 227 lb (103 kg)   LMP 05/19/2011   SpO2 99%   BMI 40.21 kg/m   Wt Readings from Last 3 Encounters:  11/06/22 229 lb (103.9 kg)  08/28/22 221 lb (100.2 kg)  05/04/22 219 lb (99.3 kg)    General: Appears her stated age, obese, in NAD. Skin: Warm, dry and intact.  Scabbed lesion to the left thigh. HEENT: Head: normal shape and size; Eyes: sclera white, no icterus, conjunctiva pink, PERRLA and EOMs intact;  Neck:  Neck supple, trachea midline. No masses, lumps or thyromegaly present.  Cardiovascular: Normal rate and rhythm. S1,S2 noted.  No murmur, rubs or gallops noted. No JVD or BLE edema.  Pulmonary/Chest: Normal effort and positive vesicular breath sounds. No respiratory distress. No wheezes, rales or ronchi noted.  Abdomen: Normal bowel sounds.  Musculoskeletal: Strength 5/5 BUE/BLE.  No difficulty with gait.  Neurological: Alert and oriented. Cranial nerves II-XII grossly intact. Coordination normal.  Psychiatric: Mood and affect normal. Behavior is normal. Judgment and thought content normal.    BMET    Component Value Date/Time   NA 142 09/02/2022 0703   K 4.4 09/02/2022 0703   CL 103 09/02/2022 0703   CO2 23 09/02/2022 0703   GLUCOSE 81 09/02/2022 0703   GLUCOSE 90 03/18/2022  0830   BUN 23 09/02/2022 0703   CREATININE 0.87 09/02/2022 0703   CREATININE 0.79 03/18/2022 0830   CALCIUM 9.4 09/02/2022 0703   GFRNONAA 76 08/30/2019 1501   GFRAA 88 08/30/2019 1501    Lipid Panel     Component Value Date/Time   CHOL 171 09/02/2022 0703   CHOL 175 03/07/2018 0000   TRIG 154 (H) 09/02/2022 0703   TRIG 128 03/07/2018 0000   TRIG 128 03/07/2018 0000   HDL 44 09/02/2022 0703   HDL 41 03/07/2018 0000   CHOLHDL 3.9 09/02/2022 0703   CHOLHDL 3.8 03/18/2022 0830   LDLCALC 100 (H) 09/02/2022 0703   LDLCALC 103 (  H) 03/18/2022 0830    CBC    Component Value Date/Time   WBC 13.2 (H) 09/02/2022 0703   WBC 6.5 12/15/2021 0838   RBC 5.48 (H) 09/02/2022 0703   RBC 5.09 12/15/2021 0838   HGB 15.5 09/02/2022 0703   HCT 47.9 (H) 09/02/2022 0703   PLT 318 09/02/2022 0703   MCV 87 09/02/2022 0703   MCH 28.3 09/02/2022 0703   MCH 29.5 12/15/2021 0838   MCHC 32.4 09/02/2022 0703   MCHC 33.5 12/15/2021 0838   RDW 13.1 09/02/2022 0703   LYMPHSABS 1.8 12/01/2016 1131   EOSABS 0.1 12/01/2016 1131   BASOSABS 0.0 12/01/2016 1131    Hgb A1C Lab Results  Component Value Date   HGBA1C 5.7 (H) 09/02/2022           Assessment & Plan:   Preventative Health Maintenance:  Encouraged her to get a flu shot in the fall Tetanus UTD Encouraged her to get her COVID booster Pap smear UTD She does not do mammograms, history of double mastectomy Colon screening UTD Encouraged her to send a balanced diet and exercise regimen Advised to see an eye doctor and dentist annually Will check CBC, c-Met, TSH, Free T4, lipid, A1c today  RTC in 6 months, follow-up chronic conditions Nicki Reaper, NP

## 2023-01-01 NOTE — Patient Instructions (Signed)

## 2023-01-01 NOTE — Assessment & Plan Note (Signed)
Encouraged diet and exercise for weight loss ?

## 2023-01-02 LAB — COMPREHENSIVE METABOLIC PANEL
AG Ratio: 1.9 (calc) (ref 1.0–2.5)
ALT: 20 U/L (ref 6–29)
AST: 19 U/L (ref 10–35)
Albumin: 4.6 g/dL (ref 3.6–5.1)
Alkaline phosphatase (APISO): 66 U/L (ref 31–125)
BUN: 21 mg/dL (ref 7–25)
CO2: 23 mmol/L (ref 20–32)
Calcium: 9.4 mg/dL (ref 8.6–10.2)
Chloride: 107 mmol/L (ref 98–110)
Creat: 0.86 mg/dL (ref 0.50–0.99)
Globulin: 2.4 g/dL (calc) (ref 1.9–3.7)
Glucose, Bld: 91 mg/dL (ref 65–139)
Potassium: 4.3 mmol/L (ref 3.5–5.3)
Sodium: 141 mmol/L (ref 135–146)
Total Bilirubin: 0.3 mg/dL (ref 0.2–1.2)
Total Protein: 7 g/dL (ref 6.1–8.1)

## 2023-01-02 LAB — TSH+FREE T4: TSH W/REFLEX TO FT4: 4 mIU/L

## 2023-01-02 LAB — CBC
HCT: 44 % (ref 35.0–45.0)
Hemoglobin: 14.6 g/dL (ref 11.7–15.5)
MCH: 29.3 pg (ref 27.0–33.0)
MCHC: 33.2 g/dL (ref 32.0–36.0)
MCV: 88.2 fL (ref 80.0–100.0)
MPV: 10.5 fL (ref 7.5–12.5)
Platelets: 237 10*3/uL (ref 140–400)
RBC: 4.99 Million/uL (ref 3.80–5.10)
RDW: 12.5 % (ref 11.0–15.0)
WBC: 7.8 10*3/uL (ref 3.8–10.8)

## 2023-01-02 LAB — LIPID PANEL
Cholesterol: 155 mg/dL
HDL: 47 mg/dL — ABNORMAL LOW
LDL Cholesterol (Calc): 77 mg/dL
Non-HDL Cholesterol (Calc): 108 mg/dL
Total CHOL/HDL Ratio: 3.3 (calc)
Triglycerides: 211 mg/dL — ABNORMAL HIGH

## 2023-01-02 LAB — HEMOGLOBIN A1C
Hgb A1c MFr Bld: 5.7 % of total Hgb — ABNORMAL HIGH (ref <5.7)
Mean Plasma Glucose: 117 mg/dL
eAG (mmol/L): 6.5 mmol/L

## 2023-01-04 ENCOUNTER — Encounter: Payer: Self-pay | Admitting: Internal Medicine

## 2023-01-04 MED ORDER — FENOFIBRATE 54 MG PO TABS: 54.0000 mg | ORAL_TABLET | Freq: Every day | ORAL | 1 refills | Status: DC

## 2023-01-04 MED ORDER — TIRZEPATIDE-WEIGHT MANAGEMENT 2.5 MG/0.5ML ~~LOC~~ SOAJ: 2.5000 mg | SUBCUTANEOUS | 0 refills | Status: DC

## 2023-01-06 ENCOUNTER — Other Ambulatory Visit: Payer: Self-pay | Admitting: Internal Medicine

## 2023-01-06 NOTE — Telephone Encounter (Signed)
Requested Prescriptions  Pending Prescriptions Disp Refills   levothyroxine (SYNTHROID) 100 MCG tablet [Pharmacy Med Name: LEVOTHYROXINE 0.100MG  ( ) TAB] 90 tablet 1    Sig: TAKE 1 TABLET(100 MCG) BY MOUTH DAILY BEFORE BREAKFAST     Endocrinology:  Hypothyroid Agents Passed - 01/06/2023  9:07 AM      Passed - TSH in normal range and within 360 days    TSH  Date Value Ref Range Status  09/02/2022 4.110 0.450 - 4.500 uIU/mL Final         Passed - Valid encounter within last 12 months    Recent Outpatient Visits           5 days ago Encounter for general adult medical examination with abnormal findings   [place] [facility] [place], [place] W, NP   2 months ago Gastroesophageal reflux disease without esophagitis   [place] [facility] [place], Salvadore Oxford, NP   4 months ago GAD (generalized anxiety disorder)   [place] [facility] [place], Salvadore Oxford, NP   8 months ago Diverticulitis   [facility] [place], Salvadore Oxford, NP   9 months ago Mixed hyperlipidemia   [place] [facility] [place], Salvadore Oxford, NP       Future Appointments             In 6 months Baity, Salvadore Oxford, NP [place] [facility], [facility]

## 2023-03-10 ENCOUNTER — Other Ambulatory Visit: Payer: Self-pay | Admitting: Internal Medicine

## 2023-03-11 NOTE — Telephone Encounter (Signed)
Requested Prescriptions  Pending Prescriptions Disp Refills   sertraline (ZOLOFT) 100 MG tablet [Pharmacy Med Name: SERTRALINE 100MG  TABLETS] 90 tablet 1    Sig: TAKE 1 TABLET(100 MG) BY MOUTH DAILY     Psychiatry:  Antidepressants - SSRI - sertraline Passed - 03/10/2023 12:47 PM      Passed - AST in normal range and within 360 days    AST  Date Value Ref Range Status  01/01/2023 19 10 - 35 U/L Final         Passed - ALT in normal range and within 360 days    ALT  Date Value Ref Range Status  01/01/2023 20 6 - 29 U/L Final         Passed - Completed PHQ-2 or PHQ-9 in the last 360 days      Passed - Valid encounter within last 6 months    Recent Outpatient Visits           2 months ago Encounter for general adult medical examination with abnormal findings   [place] [facility] [place], [place] W, NP   4 months ago Gastroesophageal reflux disease without esophagitis   [facility] [place], Salvadore Oxford, NP   6 months ago GAD (generalized anxiety disorder)   [place] [facility] [place], Salvadore Oxford, NP   10 months ago Diverticulitis   [facility] [place], Salvadore Oxford, NP   11 months ago Mixed hyperlipidemia   [place] [facility] [place], Salvadore Oxford, NP       Future Appointments             In 4 months Baity, Salvadore Oxford, NP [place] [facility], [facility] [place]

## 2023-04-13 LAB — LIPID PANEL
Cholesterol: 161 (ref 0–200)
HDL: 41 (ref 35–70)
LDL Cholesterol: 94
Triglycerides: 148 (ref 40–160)

## 2023-04-13 LAB — HEMOGLOBIN A1C: Hemoglobin A1C: 5.7

## 2023-04-15 ENCOUNTER — Encounter: Payer: Self-pay | Admitting: Internal Medicine

## 2023-04-15 NOTE — Telephone Encounter (Signed)
Could you abstract cholesterol panel and A1c?

## 2023-04-22 ENCOUNTER — Encounter: Payer: Self-pay | Admitting: Internal Medicine

## 2023-04-22 MED ORDER — OMEPRAZOLE 20 MG PO CPDR: 20.0000 mg | DELAYED_RELEASE_CAPSULE | Freq: Every day | ORAL | 1 refills | Status: DC

## 2023-04-23 ENCOUNTER — Other Ambulatory Visit: Payer: Self-pay | Admitting: Internal Medicine

## 2023-04-23 NOTE — Telephone Encounter (Signed)
Requested Prescriptions  Pending Prescriptions Disp Refills   lovastatin (MEVACOR) 20 MG tablet [Pharmacy Med Name: LOVASTATIN 20MG  TABLETS] 90 tablet 0    Sig: TAKE 1 TABLET(20 MG) BY MOUTH AT BEDTIME     Cardiovascular:  Antilipid - Statins 2 Failed - 04/23/2023 10:01 AM      Failed - Lipid Panel in normal range within the last 12 months    Cholesterol, Total  Date Value Ref Range Status  09/02/2022 171 100 - 199 mg/dL Final  28/41/3244 010  Final   Cholesterol  Date Value Ref Range Status  04/13/2023 161 0 - 200 Final   LDL Cholesterol (Calc)  Date Value Ref Range Status  01/01/2023 77 mg/dL (calc) Final    Comment:    Reference range: <100 . Desirable range <100 mg/dL for primary prevention;   <70 mg/dL for patients with CHD or diabetic patients  with > or = 2 CHD risk factors. Marland Kitchen LDL-C is now calculated using the Martin-Hopkins  calculation, which is a validated novel method providing  better accuracy than the Friedewald equation in the  estimation of LDL-C.  Horald Pollen et al. Lenox Ahr. 2725;366(44): 2061-2068  (http://education.[employer].com/faq/FAQ164)    LDL Cholesterol  Date Value Ref Range Status  04/13/2023 94  Final   HDL Cholesterol  Date Value Ref Range Status  03/07/2018 41  Final   HDL  Date Value Ref Range Status  04/13/2023 41 35 - 70 Final  09/02/2022 44 >39 mg/dL Final   Triglycerides  Date Value Ref Range Status  04/13/2023 148 40 - 160 Final  03/07/2018 128  Final  03/07/2018 128  Final         Passed - Cr in normal range and within 360 days    Creat  Date Value Ref Range Status  01/01/2023 0.86 0.50 - 0.99 mg/dL Final         Passed - Patient is not pregnant      Passed - Valid encounter within last 12 months    Recent Outpatient Visits           3 months ago Encounter for general adult medical examination with abnormal findings   [place] [facility] [place], [place] W, NP   5 months ago Gastroesophageal  reflux disease without esophagitis   [place] [facility] [place], Salvadore Oxford, NP   7 months ago GAD (generalized anxiety disorder)   [place] [facility] [place], Salvadore Oxford, NP   11 months ago Diverticulitis   [place] [facility] [place], Salvadore Oxford, NP   1 year ago Mixed hyperlipidemia   [place] [facility] [place], Salvadore Oxford, NP       Future Appointments             In 2 months Baity, Salvadore Oxford, NP [facility], [facility]

## 2023-07-09 ENCOUNTER — Encounter: Payer: Self-pay | Admitting: Internal Medicine

## 2023-07-09 ENCOUNTER — Ambulatory Visit: Payer: Managed Care, Other (non HMO) | Admitting: Internal Medicine

## 2023-07-09 VITALS — BP 132/80 | Ht 63.0 in | Wt 233.8 lb

## 2023-07-09 DIAGNOSIS — F411 Generalized anxiety disorder: Secondary | ICD-10-CM

## 2023-07-09 DIAGNOSIS — K219 Gastro-esophageal reflux disease without esophagitis: Secondary | ICD-10-CM

## 2023-07-09 DIAGNOSIS — Z23 Encounter for immunization: Secondary | ICD-10-CM | POA: Diagnosis not present

## 2023-07-09 DIAGNOSIS — E039 Hypothyroidism, unspecified: Secondary | ICD-10-CM

## 2023-07-09 DIAGNOSIS — E66813 Obesity, class 3: Secondary | ICD-10-CM

## 2023-07-09 DIAGNOSIS — E782 Mixed hyperlipidemia: Secondary | ICD-10-CM | POA: Diagnosis not present

## 2023-07-09 DIAGNOSIS — Z6841 Body Mass Index (BMI) 40.0 and over, adult: Secondary | ICD-10-CM

## 2023-07-09 DIAGNOSIS — R7303 Prediabetes: Secondary | ICD-10-CM | POA: Diagnosis not present

## 2023-07-09 MED ORDER — NALTREXONE-BUPROPION HCL ER 8-90 MG PO TB12
ORAL_TABLET | ORAL | 0 refills | Status: DC
Start: 2023-07-09 — End: 2024-01-12

## 2023-07-09 NOTE — Assessment & Plan Note (Signed)
Stable on sertraline, buspirone  Support offered

## 2023-07-09 NOTE — Assessment & Plan Note (Signed)
Try to identify and avoid foods that trigger your reflux Encouraged weight loss as this can help reduce reflux symptoms Continue omeprazole 20 mg daily

## 2023-07-09 NOTE — Assessment & Plan Note (Signed)
C-Met and lipid profile today Encouraged her consume low-fat diet Continue lovastatin

## 2023-07-09 NOTE — Patient Instructions (Signed)
Prediabetes Eating Plan Prediabetes is a condition that causes blood sugar (glucose) levels to be higher than normal. This increases the risk for developing type 2 diabetes (type 2 diabetes mellitus). Working with a health care provider or nutrition specialist (dietitian) to make diet and lifestyle changes can help prevent the onset of diabetes. These changes may help you: Control your blood glucose levels. Improve your cholesterol levels. Manage your blood pressure. What are tips for following this plan? Reading food labels Read food labels to check the amount of fat, salt (sodium), and sugar in prepackaged foods. Avoid foods that have: Saturated fats. Trans fats. Added sugars. Avoid foods that have more than 300 milligrams (mg) of sodium per serving. Limit your sodium intake to less than 2,300 mg each day. Shopping Avoid buying pre-made and processed foods. Avoid buying drinks with added sugar. Cooking Cook with olive oil. Do not use butter, lard, or ghee. Bake, broil, grill, steam, or boil foods. Avoid frying. Meal planning  Work with your dietitian to create an eating plan that is right for you. This may include tracking how many calories you take in each day. Use a food diary, notebook, or mobile application to track what you eat at each meal. Consider following a Mediterranean diet. This includes: Eating several servings of fresh fruits and vegetables each day. Eating fish at least twice a week. Eating one serving each day of whole grains, beans, nuts, and seeds. Using olive oil instead of other fats. Limiting alcohol. Limiting red meat. Using nonfat or low-fat dairy products. Consider following a plant-based diet. This includes dietary choices that focus on eating mostly vegetables and fruit, grains, beans, nuts, and seeds. If you have high blood pressure, you may need to limit your sodium intake or follow a diet such as the DASH (Dietary Approaches to Stop Hypertension) eating  plan. The DASH diet aims to lower high blood pressure. Lifestyle Set weight loss goals with help from your health care team. It is recommended that most people with prediabetes lose 7% of their body weight. Exercise for at least 30 minutes 5 or more days a week. Attend a support group or seek support from a mental health counselor. Take over-the-counter and prescription medicines only as told by your health care provider. What foods are recommended? Fruits Berries. Bananas. Apples. Oranges. Grapes. Papaya. Mango. Pomegranate. Kiwi. Grapefruit. Cherries. Vegetables Lettuce. Spinach. Peas. Beets. Cauliflower. Cabbage. Broccoli. Carrots. Tomatoes. Squash. Eggplant. Herbs. Peppers. Onions. Cucumbers. Brussels sprouts. Grains Whole grains, such as whole-wheat or whole-grain breads, crackers, cereals, and pasta. Unsweetened oatmeal. Bulgur. Barley. Quinoa. Brown rice. Corn or whole-wheat flour tortillas or taco shells. Meats and other proteins Seafood. Poultry without skin. Lean cuts of pork and beef. Tofu. Eggs. Nuts. Beans. Dairy Low-fat or fat-free dairy products, such as yogurt, cottage cheese, and cheese. Beverages Water. Tea. Coffee. Sugar-free or diet soda. Seltzer water. Low-fat or nonfat milk. Milk alternatives, such as soy or almond milk. Fats and oils Olive oil. Canola oil. Sunflower oil. Grapeseed oil. Avocado. Walnuts. Sweets and desserts Sugar-free or low-fat pudding. Sugar-free or low-fat ice cream and other frozen treats. Seasonings and condiments Herbs. Sodium-free spices. Mustard. Relish. Low-salt, low-sugar ketchup. Low-salt, low-sugar barbecue sauce. Low-fat or fat-free mayonnaise. The items listed above may not be a complete list of recommended foods and beverages. Contact a dietitian for more information. What foods are not recommended? Fruits Fruits canned with syrup. Vegetables Canned vegetables. Frozen vegetables with butter or cream sauce. Grains Refined white  flour and flour   products, such as bread, pasta, snack foods, and cereals. Meats and other proteins Fatty cuts of meat. Poultry with skin. Breaded or fried meat. Processed meats. Dairy Full-fat yogurt, cheese, or milk. Beverages Sweetened drinks, such as iced tea and soda. Fats and oils Butter. Lard. Ghee. Sweets and desserts Baked goods, such as cake, cupcakes, pastries, cookies, and cheesecake. Seasonings and condiments Spice mixes with added salt. Ketchup. Barbecue sauce. Mayonnaise. The items listed above may not be a complete list of foods and beverages that are not recommended. Contact a dietitian for more information. Where to find more information American Diabetes Association: www.diabetes.org Summary You may need to make diet and lifestyle changes to help prevent the onset of diabetes. These changes can help you control blood sugar, improve cholesterol levels, and manage blood pressure. Set weight loss goals with help from your health care team. It is recommended that most people with prediabetes lose 7% of their body weight. Consider following a Mediterranean diet. This includes eating plenty of fresh fruits and vegetables, whole grains, beans, nuts, seeds, fish, and low-fat dairy, and using olive oil instead of other fats. This information is not intended to replace advice given to you by your health care provider. Make sure you discuss any questions you have with your health care provider. Document Revised: 11/02/2019 Document Reviewed: 11/02/2019 Elsevier Patient Education  2024 Elsevier Inc.  

## 2023-07-09 NOTE — Assessment & Plan Note (Signed)
Encouraged diet and exercise for weight loss Contrave sent to pharmacy

## 2023-07-09 NOTE — Progress Notes (Signed)
Subjective:    Patient ID: Gloria Li, female    DOB: 02/26/1976, 47 y.o.   MRN: [id number]  HPI  Patient presents to clinic today for follow-up of chronic conditions.  Hypothyroidism: She denies any issues on her current dose of levothyroxine.  She does not follow with endocrinology.  Anxiety: Chronic, managed on sertraline and buspirone.  She is no longer taking hydroxyzine.  She is currently seeing a therapist and she feels like this is going well.  She denies depression, SI/HI.  HLD: Her last LDL was 94, triglycerides 409, 03/2023.  She denies myalgias on lovastatin.  She tries to consume a low-fat diet.  GERD: Triggered by tomato based foods.  She has occasional breakthrough on omeprazole for which she takes additional omeprazole with good relief of symptoms.  There is no upper GI on file.  Prediabetes: Her last A1c was 5.7%, 03/2023.  She is not taking any oral diabetic medication at this time.  She does not check her sugars.    Review of Systems     Past Medical History:  Diagnosis Date   Anxiety    BRCA negative 04/2017   MyRisk neg   Bursitis    Family history of breast cancer    Genetic testing of female    BRCA negative   Hypothyroidism    Increased risk of breast cancer 04/2017   IBIS=23.65/riskscore=22%   Premature ovarian failure    Thyroid disease     Current Outpatient Medications  Medication Sig Dispense Refill   azelastine (ASTELIN) 0.1 % nasal spray Place into the nose.     busPIRone (BUSPAR) 5 MG tablet Take 1 tablet (5 mg total) by mouth every 8 (eight) hours as needed. 90 tablet 1   fenofibrate 54 MG tablet Take 1 tablet (54 mg total) by mouth daily. 90 tablet 1   hydrOXYzine (ATARAX) 10 MG tablet Take 1 tablet (10 mg total) by mouth 3 (three) times daily as needed. 90 tablet 1   levothyroxine (SYNTHROID) 100 MCG tablet TAKE 1 TABLET(100 MCG) BY MOUTH DAILY BEFORE BREAKFAST 90 tablet 1   lovastatin (MEVACOR) 20 MG tablet TAKE 1 TABLET(20 MG)  BY MOUTH AT BEDTIME 90 tablet 0   omeprazole (PRILOSEC) 20 MG capsule Take 1 capsule (20 mg total) by mouth daily. 90 capsule 1   predniSONE (DELTASONE) 10 MG tablet Take 6 tabs on day 1, 5 tabs on day 2, 4 tabs on day 3, 3 tabs on day 4, 2 tabs on day 5, 1 tab on day 6 21 tablet 0   Probiotic Product (PROBIOTIC-10 PO) Take by mouth.     sertraline (ZOLOFT) 100 MG tablet TAKE 1 TABLET(100 MG) BY MOUTH DAILY 90 tablet 1   tirzepatide (ZEPBOUND) 2.5 MG/0.5ML Pen Inject 2.5 mg into the skin once a week. 2 mL 0   No current facility-administered medications for this visit.    Allergies  Allergen Reactions   Penicillins Rash    As an infant   Sulfa Antibiotics Rash    Family History  Problem Relation Age of Onset   Bone cancer Mother 80   Breast cancer Mother 76       second dx age 66   Lung cancer Mother 21   Glaucoma Father    COPD Father    Hypertension Maternal Grandmother    Heart failure Maternal Grandfather    Colon cancer Cousin 54    Social History   Socioeconomic History   Marital status: Married  Spouse name: Not on file   Number of children: 1   Years of education: Not on file   Highest education level: GED or equivalent  Occupational History   Occupation: Team leader-third party  Tobacco Use   Smoking status: Never   Smokeless tobacco: Never  Vaping Use   Vaping status: Never Used  Substance and Sexual Activity   Alcohol use: Never   Drug use: No   Sexual activity: Yes    Partners: Male    Birth control/protection: Post-menopausal, Other-see comments    Comment: vasectomy  Other Topics Concern   Not on file  Social History Narrative   Not on file   Social Determinants of Health   Financial Resource Strain: Low Risk  (07/08/2023)   Overall Financial Resource Strain (CARDIA)    Difficulty of Paying Living Expenses: Not hard at all  Food Insecurity: No Food Insecurity (07/08/2023)   Hunger Vital Sign    Worried About Running Out of Food in the  Last Year: Never true    Ran Out of Food in the Last Year: Never true  Transportation Needs: No Transportation Needs (07/08/2023)   PRAPARE - Administrator, Civil Service (Medical): No    Lack of Transportation (Non-Medical): No  Physical Activity: Insufficiently Active (07/08/2023)   Exercise Vital Sign    Days of Exercise per Week: 2 days    Minutes of Exercise per Session: 10 min  Stress: No Stress Concern Present (07/08/2023)   [employer] of Occupational Health - Occupational Stress Questionnaire    Feeling of Stress : Only a little  Social Connections: Unknown (07/08/2023)   Social Connection and Isolation Panel [NHANES]    Frequency of Communication with Friends and Family: Three times a week    Frequency of Social Gatherings with Friends and Family: Once a week    Attends Religious Services: Patient declined    Database administrator or Organizations: No    Attends Engineer, structural: Not on file    Marital Status: Married  Catering manager Violence: Not on file     Constitutional: Denies fever, malaise, fatigue, headache or abrupt weight changes.  HEENT: Denies eye pain, eye redness, ear pain, ringing in the ears, wax buildup, runny nose, nasal congestion, bloody nose, or sore throat. Respiratory: Denies difficulty breathing, shortness of breath, cough or sputum production.   Cardiovascular: Denies chest pain, chest tightness, palpitations or swelling in the hands or feet.  Gastrointestinal: Denies abdominal pain, bloating, constipation, diarrhea or blood in the stool.  GU: Pt reports decreased libido. Denies urgency, frequency, pain with urination, burning sensation, blood in urine, odor or discharge. Musculoskeletal: Denies decrease in range of motion, difficulty with gait, muscle pain or joint pain and swelling.  Skin: Denies redness, rashes, lesions or ulcercations.  Neurological: Denies difficulty with memory, difficulty with speech or  problems with balance and coordination.  Psych: Patient has a history of anxiety.  Denies depression, SI/HI.  No other specific complaints in a complete review of systems (except as listed in HPI above).  Objective:   BP 132/80   Ht 5\' 3"  (1.6 m)   Wt 233 lb 12.8 oz (106.1 kg)   LMP 05/19/2011   BMI 41.42 kg/m   Wt Readings from Last 3 Encounters:  01/01/23 227 lb (103 kg)  11/06/22 229 lb (103.9 kg)  08/28/22 221 lb (100.2 kg)    General: Appears her stated age, obese, in NAD. Skin: Warm, dry and intact.  Neck:  Neck supple, trachea midline. No masses, lumps or thyromegaly present.  Cardiovascular: Normal rate and rhythm. S1,S2 noted.  No murmur, rubs or gallops noted. No JVD or BLE edema.  Pulmonary/Chest: Normal effort and positive vesicular breath sounds. No respiratory distress. No wheezes, rales or ronchi noted.  Musculoskeletal:  No difficulty with gait.  Neurological: Alert and oriented.  Psychiatric: Mood and affect normal. Behavior is normal. Judgment and thought content normal.    BMET    Component Value Date/Time   NA 141 01/01/2023 1454   NA 142 09/02/2022 0703   K 4.3 01/01/2023 1454   CL 107 01/01/2023 1454   CO2 23 01/01/2023 1454   GLUCOSE 91 01/01/2023 1454   BUN 21 01/01/2023 1454   BUN 23 09/02/2022 0703   CREATININE 0.86 01/01/2023 1454   CALCIUM 9.4 01/01/2023 1454   GFRNONAA 76 08/30/2019 1501   GFRAA 88 08/30/2019 1501    Lipid Panel     Component Value Date/Time   CHOL 161 04/13/2023 0000   CHOL 171 09/02/2022 0703   CHOL 175 03/07/2018 0000   TRIG 148 04/13/2023 0000   TRIG 128 03/07/2018 0000   TRIG 128 03/07/2018 0000   HDL 41 04/13/2023 0000   HDL 44 09/02/2022 0703   HDL 41 03/07/2018 0000   CHOLHDL 3.3 01/01/2023 1454   LDLCALC 94 04/13/2023 0000   LDLCALC 77 01/01/2023 1454    CBC    Component Value Date/Time   WBC 7.8 01/01/2023 1454   RBC 4.99 01/01/2023 1454   HGB 14.6 01/01/2023 1454   HGB 15.5 09/02/2022 0703    HCT 44.0 01/01/2023 1454   HCT 47.9 (H) 09/02/2022 0703   PLT 237 01/01/2023 1454   PLT 318 09/02/2022 0703   MCV 88.2 01/01/2023 1454   MCV 87 09/02/2022 0703   MCH 29.3 01/01/2023 1454   MCHC 33.2 01/01/2023 1454   RDW 12.5 01/01/2023 1454   RDW 13.1 09/02/2022 0703   LYMPHSABS 1.8 12/01/2016 1131   EOSABS 0.1 12/01/2016 1131   BASOSABS 0.0 12/01/2016 1131    Hgb A1C Lab Results  Component Value Date   HGBA1C 5.7 04/13/2023            Assessment & Plan:     RTC in 6 months for your annual exam Nicki Reaper, NP

## 2023-07-09 NOTE — Assessment & Plan Note (Signed)
A1c today Encourage low-carb diet and exercise for weight loss

## 2023-07-09 NOTE — Assessment & Plan Note (Signed)
TSH and free T4 today We will adjust levothyroxine if needed based on labs

## 2023-07-13 LAB — CBC
Hematocrit: 45.7 % (ref 34.0–46.6)
Hemoglobin: 14.7 g/dL (ref 11.1–15.9)
MCH: 28.3 pg (ref 26.6–33.0)
MCHC: 32.2 g/dL (ref 31.5–35.7)
MCV: 88 fL (ref 79–97)
Platelets: 270 10*3/uL (ref 150–450)
RBC: 5.19 x10E6/uL (ref 3.77–5.28)
RDW: 12.8 % (ref 11.7–15.4)
WBC: 6.2 10*3/uL (ref 3.4–10.8)

## 2023-07-13 LAB — LIPID PANEL
Chol/HDL Ratio: 3.8 {ratio} (ref 0.0–4.4)
Cholesterol, Total: 152 mg/dL (ref 100–199)
HDL: 40 mg/dL (ref >39)
LDL Chol Calc (NIH): 93 mg/dL (ref 0–99)
Triglycerides: 104 mg/dL (ref 0–149)
VLDL Cholesterol Cal: 19 mg/dL (ref 5–40)

## 2023-07-13 LAB — COMPREHENSIVE METABOLIC PANEL
ALT: 25 [IU]/L (ref 0–32)
AST: 21 [IU]/L (ref 0–40)
Albumin: 4.5 g/dL (ref 3.9–4.9)
Alkaline Phosphatase: 72 [IU]/L (ref 44–121)
BUN/Creatinine Ratio: 16 (ref 9–23)
BUN: 16 mg/dL (ref 6–24)
Bilirubin Total: 0.3 mg/dL (ref 0.0–1.2)
CO2: 22 mmol/L (ref 20–29)
Calcium: 9.5 mg/dL (ref 8.7–10.2)
Chloride: 105 mmol/L (ref 96–106)
Creatinine, Ser: 0.97 mg/dL (ref 0.57–1.00)
Globulin, Total: 2.4 g/dL (ref 1.5–4.5)
Glucose: 106 mg/dL — ABNORMAL HIGH (ref 70–99)
Potassium: 4.7 mmol/L (ref 3.5–5.2)
Sodium: 141 mmol/L (ref 134–144)
Total Protein: 6.9 g/dL (ref 6.0–8.5)
eGFR: 72 mL/min/{1.73_m2} (ref >59)

## 2023-07-13 LAB — HEMOGLOBIN A1C
Est. average glucose Bld gHb Est-mCnc: 120 mg/dL
Hgb A1c MFr Bld: 5.8 % — ABNORMAL HIGH (ref 4.8–5.6)

## 2023-07-13 LAB — TSH: TSH: 6.55 u[IU]/mL — ABNORMAL HIGH (ref 0.450–4.500)

## 2023-07-13 LAB — T4, FREE: Free T4: 1.51 ng/dL (ref 0.82–1.77)

## 2023-07-16 ENCOUNTER — Other Ambulatory Visit: Payer: Self-pay | Admitting: Internal Medicine

## 2023-07-18 ENCOUNTER — Other Ambulatory Visit: Payer: Self-pay | Admitting: Internal Medicine

## 2023-07-19 ENCOUNTER — Encounter: Payer: Self-pay | Admitting: Internal Medicine

## 2023-07-20 NOTE — Telephone Encounter (Signed)
Dc'd 07/09/23 by Pamala Hurry NP  Requested Prescriptions  Refused Prescriptions Disp Refills   fenofibrate 54 MG tablet [Pharmacy Med Name: FENOFIBRATE 54MG  TABLETS] 90 tablet 1    Sig: TAKE 1 TABLET(54 MG) BY MOUTH DAILY     Cardiovascular:  Antilipid - Fibric Acid Derivatives Failed - 07/16/2023 12:44 PM      Failed - Lipid Panel in normal range within the last 12 months    Cholesterol, Total  Date Value Ref Range Status  07/12/2023 152 100 - 199 mg/dL Final  11/91/4782 956  Final   LDL Cholesterol (Calc)  Date Value Ref Range Status  01/01/2023 77 mg/dL (calc) Final    Comment:    Reference range: <100 . Desirable range <100 mg/dL for primary prevention;   <70 mg/dL for patients with CHD or diabetic patients  with > or = 2 CHD risk factors. Marland Kitchen LDL-C is now calculated using the Martin-Hopkins  calculation, which is a validated novel method providing  better accuracy than the Friedewald equation in the  estimation of LDL-C.  Horald Pollen et al. Lenox Ahr. 2130;865(78): 2061-2068  (http://education.[facility].com/faq/FAQ164)    LDL Chol Calc (NIH)  Date Value Ref Range Status  07/12/2023 93 0 - 99 mg/dL Final   HDL Cholesterol  Date Value Ref Range Status  03/07/2018 41  Final   HDL  Date Value Ref Range Status  07/12/2023 40 >39 mg/dL Final   Triglycerides  Date Value Ref Range Status  07/12/2023 104 0 - 149 mg/dL Final  46/96/2952 841  Final  03/07/2018 128  Final         Passed - ALT in normal range and within 360 days    ALT  Date Value Ref Range Status  07/12/2023 25 0 - 32 IU/L Final         Passed - AST in normal range and within 360 days    AST  Date Value Ref Range Status  07/12/2023 21 0 - 40 IU/L Final         Passed - Cr in normal range and within 360 days    Creat  Date Value Ref Range Status  01/01/2023 0.86 0.50 - 0.99 mg/dL Final   Creatinine, Ser  Date Value Ref Range Status  07/12/2023 0.97 0.57 - 1.00 mg/dL Final         Passed -  HGB in normal range and within 360 days    Hemoglobin  Date Value Ref Range Status  07/12/2023 14.7 11.1 - 15.9 g/dL Final         Passed - HCT in normal range and within 360 days    Hematocrit  Date Value Ref Range Status  07/12/2023 45.7 34.0 - 46.6 % Final         Passed - PLT in normal range and within 360 days    Platelets  Date Value Ref Range Status  07/12/2023 270 150 - 450 x10E3/uL Final         Passed - WBC in normal range and within 360 days    WBC  Date Value Ref Range Status  07/12/2023 6.2 3.4 - 10.8 x10E3/uL Final    Comment:    **Effective July 19, 2023 profile 005025 WBC will be made**   non-orderable as a stand-alone order code.   01/01/2023 7.8 3.8 - 10.8 Thousand/uL Final         Passed - eGFR is 30 or above and within 360 days    GFR calc Af  Amer  Date Value Ref Range Status  08/30/2019 88 >59 mL/min/1.73 Final   GFR calc non Af Amer  Date Value Ref Range Status  08/30/2019 76 >59 mL/min/1.73 Final   eGFR  Date Value Ref Range Status  07/12/2023 72 >59 mL/min/1.73 Final         Passed - Valid encounter within last 12 months    Recent Outpatient Visits           1 week ago Acquired hypothyroidism   [place] [facility] [place], Salvadore Oxford, NP   6 months ago Encounter for general adult medical examination with abnormal findings   [place] [facility] [place], [place] W, NP   8 months ago Gastroesophageal reflux disease without esophagitis   [place] [facility] [place], Salvadore Oxford, NP   10 months ago GAD (generalized anxiety disorder)   [facility], Salvadore Oxford, NP   1 year ago Diverticulitis   [place] [facility] [place], Salvadore Oxford, NP       Future Appointments             In 5 months Baity, Salvadore Oxford, NP [place] [facility], [facility]

## 2023-07-21 MED ORDER — FENOFIBRATE 54 MG PO TABS: 54.0000 mg | ORAL_TABLET | Freq: Every day | ORAL | 1 refills | Status: DC

## 2023-07-21 MED ORDER — LEVOTHYROXINE SODIUM 100 MCG PO TABS: ORAL_TABLET | ORAL | 1 refills | Status: DC

## 2023-07-28 ENCOUNTER — Other Ambulatory Visit: Payer: Self-pay | Admitting: Internal Medicine

## 2023-07-29 NOTE — Telephone Encounter (Signed)
Requested Prescriptions  Pending Prescriptions Disp Refills   lovastatin (MEVACOR) 20 MG tablet [Pharmacy Med Name: LOVASTATIN 20MG  TABLETS] 90 tablet 3    Sig: TAKE 1 TABLET(20 MG) BY MOUTH AT BEDTIME     Cardiovascular:  Antilipid - Statins 2 Failed - 07/29/2023  9:08 AM      Failed - Lipid Panel in normal range within the last 12 months    Cholesterol, Total  Date Value Ref Range Status  07/12/2023 152 100 - 199 mg/dL Final  41/32/4401 027  Final   LDL Cholesterol (Calc)  Date Value Ref Range Status  01/01/2023 77 mg/dL (calc) Final    Comment:    Reference range: <100 . Desirable range <100 mg/dL for primary prevention;   <70 mg/dL for patients with CHD or diabetic patients  with > or = 2 CHD risk factors. Marland Kitchen LDL-C is now calculated using the Martin-Hopkins  calculation, which is a validated novel method providing  better accuracy than the Friedewald equation in the  estimation of LDL-C.  Horald Pollen et al. Lenox Ahr. 2536;644(03): 2061-2068  (http://education.[facility].com/faq/FAQ164)    LDL Chol Calc (NIH)  Date Value Ref Range Status  07/12/2023 93 0 - 99 mg/dL Final   HDL Cholesterol  Date Value Ref Range Status  03/07/2018 41  Final   HDL  Date Value Ref Range Status  07/12/2023 40 >39 mg/dL Final   Triglycerides  Date Value Ref Range Status  07/12/2023 104 0 - 149 mg/dL Final  47/42/5956 387  Final  03/07/2018 128  Final         Passed - Cr in normal range and within 360 days    Creat  Date Value Ref Range Status  01/01/2023 0.86 0.50 - 0.99 mg/dL Final   Creatinine, Ser  Date Value Ref Range Status  07/12/2023 0.97 0.57 - 1.00 mg/dL Final         Passed - Patient is not pregnant      Passed - Valid encounter within last 12 months    Recent Outpatient Visits           2 weeks ago Acquired hypothyroidism   [place] [facility] [place], Salvadore Oxford, NP   6 months ago Encounter for general adult medical examination with abnormal  findings   [place] [facility], [place] W, NP   8 months ago Gastroesophageal reflux disease without esophagitis   [facility] [place], Salvadore Oxford, NP   11 months ago GAD (generalized anxiety disorder)   [place] [facility], Salvadore Oxford, NP   1 year ago Diverticulitis   [place] [facility] [place], Salvadore Oxford, NP       Future Appointments             In 5 months Baity, Salvadore Oxford, NP [place] [facility], [facility]

## 2023-08-02 ENCOUNTER — Encounter: Payer: Self-pay | Admitting: Internal Medicine

## 2023-09-16 ENCOUNTER — Other Ambulatory Visit: Payer: Self-pay | Admitting: Internal Medicine

## 2023-09-17 NOTE — Telephone Encounter (Signed)
Requested Prescriptions  Refused Prescriptions Disp Refills   omeprazole (PRILOSEC) 20 MG capsule [Pharmacy Med Name: OMEPRAZOLE 20MG  CAPSULES] 90 capsule 1    Sig: TAKE 1 CAPSULE(20 MG) BY MOUTH DAILY     Gastroenterology: Proton Pump Inhibitors Passed - 09/17/2023  2:00 PM      Passed - Valid encounter within last 12 months    Recent Outpatient Visits           2 months ago Acquired hypothyroidism   [place] [facility] [place], Salvadore Oxford, NP   8 months ago Encounter for general adult medical examination with abnormal findings   [place] [facility] [place], Salvadore Oxford, NP   10 months ago Gastroesophageal reflux disease without esophagitis   [place] [facility] [place], Salvadore Oxford, NP   1 year ago GAD (generalized anxiety disorder)   [place] [facility] [place], Salvadore Oxford, NP   1 year ago Diverticulitis   [place] [facility] [place], Salvadore Oxford, NP       Future Appointments             In 3 months Baity, Salvadore Oxford, NP [place] [facility], [facility]

## 2023-09-17 NOTE — Progress Notes (Signed)
Pa started   Gloria Li (Key: [id number]) Rx #: [id number] Need Help? Call us at [phone] Status New (Not sent to plan) Drug Zepbound 2.5MG /0.5ML pen-injectors ePA cloud logo Form [facility] Electronic Prior Authorization Form [phone] NCPDP) Original Claim Info 71  Approved on January 31 by [facility] 2017 NCPDP Request Reference Number: [id number]. ZEPBOUND INJ 2.5MG  is approved through 03/16/2024. Your patient may now fill this prescription and it will be covered. Authorization Expiration Date: 03/16/2024

## 2023-10-21 ENCOUNTER — Ambulatory Visit: Payer: Managed Care, Other (non HMO) | Admitting: Gastroenterology

## 2023-12-07 ENCOUNTER — Other Ambulatory Visit: Payer: Self-pay | Admitting: Internal Medicine

## 2023-12-08 NOTE — Telephone Encounter (Signed)
 Requested Prescriptions  Pending Prescriptions Disp Refills   sertraline  (ZOLOFT ) 100 MG tablet [Pharmacy Med Name: SERTRALINE  100MG  TABLETS] 90 tablet 0    Sig: TAKE 1 TABLET(100 MG) BY MOUTH DAILY     Psychiatry:  Antidepressants - SSRI - sertraline  Failed - 12/08/2023  9:45 AM      Failed - Valid encounter within last 6 months    Recent Outpatient Visits   None            Passed - AST in normal range and within 360 days    AST  Date Value Ref Range Status  07/12/2023 21 0 - 40 IU/L Final         Passed - ALT in normal range and within 360 days    ALT  Date Value Ref Range Status  07/12/2023 25 0 - 32 IU/L Final         Passed - Completed PHQ-2 or PHQ-9 in the last 360 days

## 2023-12-27 ENCOUNTER — Encounter: Payer: Self-pay | Admitting: Internal Medicine

## 2023-12-27 MED ORDER — BUSPIRONE HCL 5 MG PO TABS: 5.0000 mg | ORAL_TABLET | Freq: Three times a day (TID) | ORAL | 1 refills | Status: DC | PRN

## 2024-01-07 ENCOUNTER — Encounter: Payer: Managed Care, Other (non HMO) | Admitting: Internal Medicine

## 2024-01-11 ENCOUNTER — Ambulatory Visit: Admitting: Internal Medicine

## 2024-01-12 ENCOUNTER — Ambulatory Visit (INDEPENDENT_AMBULATORY_CARE_PROVIDER_SITE_OTHER): Admitting: Internal Medicine

## 2024-01-12 VITALS — BP 134/78 | Ht 63.0 in | Wt 228.8 lb

## 2024-01-12 DIAGNOSIS — R7303 Prediabetes: Secondary | ICD-10-CM | POA: Diagnosis not present

## 2024-01-12 DIAGNOSIS — E66813 Obesity, class 3: Secondary | ICD-10-CM

## 2024-01-12 DIAGNOSIS — E559 Vitamin D deficiency, unspecified: Secondary | ICD-10-CM

## 2024-01-12 DIAGNOSIS — E039 Hypothyroidism, unspecified: Secondary | ICD-10-CM

## 2024-01-12 DIAGNOSIS — Z0001 Encounter for general adult medical examination with abnormal findings: Secondary | ICD-10-CM | POA: Diagnosis not present

## 2024-01-12 DIAGNOSIS — E782 Mixed hyperlipidemia: Secondary | ICD-10-CM

## 2024-01-12 DIAGNOSIS — E538 Deficiency of other specified B group vitamins: Secondary | ICD-10-CM

## 2024-01-12 DIAGNOSIS — Z6841 Body Mass Index (BMI) 40.0 and over, adult: Secondary | ICD-10-CM

## 2024-01-12 MED ORDER — SERTRALINE HCL 100 MG PO TABS: 100.0000 mg | ORAL_TABLET | Freq: Every day | ORAL | 0 refills | Status: DC

## 2024-01-12 MED ORDER — WEGOVY 0.25 MG/0.5ML ~~LOC~~ SOAJ: 0.2500 mg | SUBCUTANEOUS | Status: DC

## 2024-01-12 MED ORDER — ALPRAZOLAM 0.5 MG PO TABS: 0.5000 mg | ORAL_TABLET | Freq: Every day | ORAL | 0 refills | Status: AC | PRN

## 2024-01-12 MED ORDER — OMEPRAZOLE 20 MG PO CPDR: 20.0000 mg | DELAYED_RELEASE_CAPSULE | Freq: Every day | ORAL | 1 refills | Status: AC

## 2024-01-12 NOTE — Patient Instructions (Signed)

## 2024-01-12 NOTE — Progress Notes (Signed)
 Subjective:    Patient ID: Gloria Li, female    DOB: [birth year], 48 y.o.   MRN: [id number]  HPI  Patient presents to clinic today for her annual exam.  Flu: 06/2023 Tetanus: 07/2018 COVID: Pfizer x 2 Pap smear: 12/2021 Mammogram: never, double mastectomy Colon screening: 12/2019 Vision screening: annually Dentist: biannually  Diet: She does eat meat. She consumes fruits and veggies. She does eat some fried foods. She drinks mostly water, coffee, zero sugar soda. Exercise: None  Review of Systems     Past Medical History:  Diagnosis Date   Anxiety    BRCA negative 04/2017   MyRisk neg   Bursitis    Family history of breast cancer    Genetic testing of female    BRCA negative   Hypothyroidism    Increased risk of breast cancer 04/2017   IBIS=23.65/riskscore=22%   Premature ovarian failure    Thyroid disease     Current Outpatient Medications  Medication Sig Dispense Refill   azelastine (ASTELIN) 0.1 % nasal spray Place into the nose.     busPIRone  (BUSPAR ) 5 MG tablet Take 1 tablet (5 mg total) by mouth every 8 (eight) hours as needed. 90 tablet 1   fenofibrate  54 MG tablet Take 1 tablet (54 mg total) by mouth daily. 90 tablet 1   levothyroxine  (SYNTHROID ) 100 MCG tablet TAKE 1 TABLET(100 MCG) BY MOUTH DAILY BEFORE BREAKFAST 90 tablet 1   lovastatin  (MEVACOR ) 20 MG tablet TAKE 1 TABLET(20 MG) BY MOUTH AT BEDTIME 90 tablet 3   Naltrexone -buPROPion  HCl ER 8-90 MG TB12 Start 1 tablet every morning for 7 days, then 1 tablet twice daily for 7 days, then 2 tablets every morning and one in the evening 120 tablet 0   omeprazole  (PRILOSEC) 20 MG capsule Take 1 capsule (20 mg total) by mouth daily. 90 capsule 1   Probiotic Product (PROBIOTIC-10 PO) Take by mouth.     sertraline  (ZOLOFT ) 100 MG tablet TAKE 1 TABLET(100 MG) BY MOUTH DAILY 90 tablet 0   No current facility-administered medications for this visit.    Allergies  Allergen Reactions   Penicillins Rash     As an infant   Sulfa Antibiotics Rash    Family History  Problem Relation Age of Onset   Bone cancer Mother 72   Breast cancer Mother 71       second dx age 53   Lung cancer Mother 69   Glaucoma Father    COPD Father    Hypertension Maternal Grandmother    Heart failure Maternal Grandfather    Colon cancer Cousin 32    Social History   Socioeconomic History   Marital status: Married    Spouse name: Not on file   Number of children: 1   Years of education: Not on file   Highest education level: GED or equivalent  Occupational History   Occupation: Team leader-third party  Tobacco Use   Smoking status: Never   Smokeless tobacco: Never  Vaping Use   Vaping status: Never Used  Substance and Sexual Activity   Alcohol use: Never   Drug use: No   Sexual activity: Yes    Partners: Male    Birth control/protection: Post-menopausal, Other-see comments    Comment: vasectomy  Other Topics Concern   Not on file  Social History Narrative   Not on file   Social Drivers of Health   Financial Resource Strain: Low Risk  (01/11/2024)   Overall Financial Resource  Strain (CARDIA)    Difficulty of Paying Living Expenses: Not hard at all  Food Insecurity: No Food Insecurity (01/11/2024)   Hunger Vital Sign    Worried About Running Out of Food in the Last Year: Never true    Ran Out of Food in the Last Year: Never true  Transportation Needs: No Transportation Needs (01/11/2024)   PRAPARE - Administrator, Civil Service (Medical): No    Lack of Transportation (Non-Medical): No  Physical Activity: Insufficiently Active (01/11/2024)   Exercise Vital Sign    Days of Exercise per Week: 2 days    Minutes of Exercise per Session: 10 min  Stress: No Stress Concern Present (01/11/2024)   [employer] of Occupational Health - Occupational Stress Questionnaire    Feeling of Stress : Not at all  Social Connections: Unknown (01/11/2024)   Social Connection and Isolation  Panel [NHANES]    Frequency of Communication with Friends and Family: Twice a week    Frequency of Social Gatherings with Friends and Family: Once a week    Attends Religious Services: Patient declined    Database administrator or Organizations: No    Attends Banker Meetings: Patient declined    Marital Status: Married  Catering manager Violence: Patient Declined (07/09/2023)   Humiliation, Afraid, Rape, and Kick questionnaire    Fear of Current or Ex-Partner: Patient declined    Emotionally Abused: Patient declined    Physically Abused: Patient declined    Sexually Abused: Patient declined     Constitutional: Denies fever, malaise, fatigue, headache or abrupt weight changes.  HEENT: Denies eye pain, eye redness, ear pain, ringing in the ears, wax buildup, runny nose, nasal congestion, bloody nose, or sore throat. Respiratory: Denies difficulty breathing, shortness of breath, cough or sputum production.   Cardiovascular: Denies chest pain, chest tightness, palpitations or swelling in the hands or feet.  Gastrointestinal: Patient reports intermittent reflux.  Denies abdominal pain, bloating, constipation, diarrhea or blood in the stool.  GU: Denies urgency, frequency, pain with urination, burning sensation, blood in urine, odor or discharge. Musculoskeletal: Denies decrease in range of motion, difficulty with gait, muscle pain or joint pain and swelling.  Skin: Denies redness, rashes, lesions or ulcercations.  Neurological: Denies dizziness, difficulty with memory, difficulty with speech or problems with balance and coordination.  Psych: Pt has a history of anxiety. Denies depression, SI/HI.  No other specific complaints in a complete review of systems (except as listed in HPI above).  Objective:   Physical Exam  BP 134/78 (BP Location: Left Arm, Patient Position: Sitting, Cuff Size: Normal)   Ht 5\' 3"  (1.6 m)   Wt 228 lb 12.8 oz (103.8 kg)   LMP 05/19/2011   BMI  40.53 kg/m    Wt Readings from Last 3 Encounters:  07/09/23 233 lb 12.8 oz (106.1 kg)  01/01/23 227 lb (103 kg)  11/06/22 229 lb (103.9 kg)    General: Appears her stated age, obese, in NAD. Skin: Warm, dry and intact.  Skin tags noted of neck. HEENT: Head: normal shape and size; Eyes: sclera white, no icterus, conjunctiva pink, PERRLA and EOMs intact;  Neck:  Neck supple, trachea midline. No masses, lumps or thyromegaly present.  Cardiovascular: Normal rate and rhythm. S1,S2 noted.  No murmur, rubs or gallops noted. No JVD or BLE edema.  Pulmonary/Chest: Normal effort and positive vesicular breath sounds. No respiratory distress. No wheezes, rales or ronchi noted.  Abdomen: Normal bowel sounds.  Musculoskeletal: Strength 5/5 BUE/BLE.  No difficulty with gait.  Neurological: Alert and oriented. Cranial nerves II-XII grossly intact. Coordination normal.  Psychiatric: Mood and affect normal. Behavior is normal. Judgment and thought content normal.    BMET    Component Value Date/Time   NA 141 07/12/2023 0706   K 4.7 07/12/2023 0706   CL 105 07/12/2023 0706   CO2 22 07/12/2023 0706   GLUCOSE 106 (H) 07/12/2023 0706   GLUCOSE 91 01/01/2023 1454   BUN 16 07/12/2023 0706   CREATININE 0.97 07/12/2023 0706   CREATININE 0.86 01/01/2023 1454   CALCIUM 9.5 07/12/2023 0706   GFRNONAA 76 08/30/2019 1501   GFRAA 88 08/30/2019 1501    Lipid Panel     Component Value Date/Time   CHOL 152 07/12/2023 0706   CHOL 175 03/07/2018 0000   TRIG 104 07/12/2023 0706   TRIG 128 03/07/2018 0000   TRIG 128 03/07/2018 0000   HDL 40 07/12/2023 0706   HDL 41 03/07/2018 0000   CHOLHDL 3.8 07/12/2023 0706   CHOLHDL 3.3 01/01/2023 1454   LDLCALC 93 07/12/2023 0706   LDLCALC 77 01/01/2023 1454    CBC    Component Value Date/Time   WBC 6.2 07/12/2023 0706   WBC 7.8 01/01/2023 1454   RBC 5.19 07/12/2023 0706   RBC 4.99 01/01/2023 1454   HGB 14.7 07/12/2023 0706   HCT 45.7 07/12/2023 0706    PLT 270 07/12/2023 0706   MCV 88 07/12/2023 0706   MCH 28.3 07/12/2023 0706   MCH 29.3 01/01/2023 1454   MCHC 32.2 07/12/2023 0706   MCHC 33.2 01/01/2023 1454   RDW 12.8 07/12/2023 0706   LYMPHSABS 1.8 12/01/2016 1131   EOSABS 0.1 12/01/2016 1131   BASOSABS 0.0 12/01/2016 1131    Hgb A1C Lab Results  Component Value Date   HGBA1C 5.8 (H) 07/12/2023           Assessment & Plan:   Preventative Health Maintenance:  Encouraged her to get a flu shot in the fall Tetanus UTD Encouraged her to get her COVID booster Pap smear UTD She does not do mammograms, history of double mastectomy Colon screening UTD Encouraged her to send a balanced diet and exercise regimen Advised to see an eye doctor and dentist annually Will check CBC, c-Met, TSH, Free T4, lipid, vitamin D, B12 and A1c today  RTC in 6 months, follow-up chronic conditions Helayne Lo, NP

## 2024-01-12 NOTE — Assessment & Plan Note (Signed)
 Sample of Wegovy provided If tolerated, will send in Wegovy 0.5 mg weekly thereafter Reinforced DASH diet and exercise for weight

## 2024-01-13 ENCOUNTER — Ambulatory Visit: Payer: Self-pay | Admitting: Internal Medicine

## 2024-01-13 ENCOUNTER — Encounter: Payer: Self-pay | Admitting: Internal Medicine

## 2024-01-13 LAB — CBC
HCT: 48.5 % — ABNORMAL HIGH (ref 35.0–45.0)
Hemoglobin: 15.5 g/dL (ref 11.7–15.5)
MCH: 27.9 pg (ref 27.0–33.0)
MCHC: 32 g/dL (ref 32.0–36.0)
MCV: 87.4 fL (ref 80.0–100.0)
MPV: 10.4 fL (ref 7.5–12.5)
Platelets: 253 10*3/uL (ref 140–400)
RBC: 5.55 10*6/uL — ABNORMAL HIGH (ref 3.80–5.10)
RDW: 13.5 % (ref 11.0–15.0)
WBC: 7.8 10*3/uL (ref 3.8–10.8)

## 2024-01-13 LAB — COMPREHENSIVE METABOLIC PANEL WITH GFR
AG Ratio: 2 (calc) (ref 1.0–2.5)
ALT: 18 U/L (ref 6–29)
AST: 18 U/L (ref 10–35)
Albumin: 4.9 g/dL (ref 3.6–5.1)
Alkaline phosphatase (APISO): 70 U/L (ref 31–125)
BUN: 21 mg/dL (ref 7–25)
CO2: 25 mmol/L (ref 20–32)
Calcium: 9.8 mg/dL (ref 8.6–10.2)
Chloride: 104 mmol/L (ref 98–110)
Creat: 0.8 mg/dL (ref 0.50–0.99)
Globulin: 2.5 g/dL (ref 1.9–3.7)
Glucose, Bld: 82 mg/dL (ref 65–99)
Potassium: 4.3 mmol/L (ref 3.5–5.3)
Sodium: 140 mmol/L (ref 135–146)
Total Bilirubin: 0.5 mg/dL (ref 0.2–1.2)
Total Protein: 7.4 g/dL (ref 6.1–8.1)
eGFR: 90 mL/min/{1.73_m2} (ref >=60)

## 2024-01-13 LAB — VITAMIN B12: Vitamin B-12: 353 pg/mL (ref 200–1100)

## 2024-01-13 LAB — HEMOGLOBIN A1C
Hgb A1c MFr Bld: 5.9 % — ABNORMAL HIGH (ref <5.7)
Mean Plasma Glucose: 123 mg/dL
eAG (mmol/L): 6.8 mmol/L

## 2024-01-13 LAB — VITAMIN D 25 HYDROXY (VIT D DEFICIENCY, FRACTURES): Vit D, 25-Hydroxy: 29 ng/mL — ABNORMAL LOW (ref 30–100)

## 2024-01-13 LAB — TSH: TSH: 2.6 m[IU]/L

## 2024-01-13 LAB — LIPID PANEL
Cholesterol: 161 mg/dL (ref <200)
HDL: 45 mg/dL — ABNORMAL LOW (ref >=50)
LDL Cholesterol (Calc): 82 mg/dL
Non-HDL Cholesterol (Calc): 116 mg/dL (ref <130)
Total CHOL/HDL Ratio: 3.6 (calc) (ref <5.0)
Triglycerides: 257 mg/dL — ABNORMAL HIGH (ref <150)

## 2024-01-13 LAB — T4, FREE: Free T4: 1.4 ng/dL (ref 0.8–1.8)

## 2024-01-31 ENCOUNTER — Other Ambulatory Visit: Payer: Self-pay | Admitting: Internal Medicine

## 2024-02-02 ENCOUNTER — Other Ambulatory Visit: Payer: Self-pay

## 2024-02-02 ENCOUNTER — Encounter: Payer: Self-pay | Admitting: Internal Medicine

## 2024-02-02 MED ORDER — LEVOTHYROXINE SODIUM 100 MCG PO TABS: ORAL_TABLET | ORAL | 1 refills | Status: DC

## 2024-02-02 NOTE — Telephone Encounter (Signed)
 Duplicate, refilled 02/02/24.  Requested Prescriptions  Pending Prescriptions Disp Refills   levothyroxine  (SYNTHROID ) 100 MCG tablet [Pharmacy Med Name: LEVOTHYROXINE  0.100MG  ( ) TAB] 90 tablet 1    Sig: TAKE 1 TABLET(100 MCG) BY MOUTH DAILY BEFORE BREAKFAST     Endocrinology:  Hypothyroid Agents Failed - 02/02/2024 10:43 AM      Failed - Valid encounter within last 12 months    Recent Outpatient Visits           3 weeks ago Encounter for general adult medical examination with abnormal findings   [place] [facility] [place], [place] W, NP              Passed - TSH in normal range and within 360 days    TSH  Date Value Ref Range Status  01/12/2024 2.60 mIU/L Final    Comment:              Reference Range .           > or = 20 Years  0.40-4.50 .                Pregnancy Ranges           First trimester    0.26-2.66           Second trimester   0.55-2.73           Third trimester    0.43-2.91

## 2024-02-03 ENCOUNTER — Other Ambulatory Visit: Payer: Self-pay | Admitting: Internal Medicine

## 2024-02-07 NOTE — Telephone Encounter (Signed)
 No longer on current medication list Requested Prescriptions  Pending Prescriptions Disp Refills   fenofibrate  54 MG tablet [Pharmacy Med Name: FENOFIBRATE  54MG  TABLETS] 90 tablet 1    Sig: TAKE 1 TABLET(54 MG) BY MOUTH DAILY     There is no refill protocol information for this order

## 2024-02-21 ENCOUNTER — Telehealth: Payer: Self-pay

## 2024-02-21 NOTE — Telephone Encounter (Signed)
 Pharmacy Patient Advocate Encounter   Received notification from CoverMyMeds that prior authorization for Zepbound  2.5MG /0.5ML pen-injectors is required/requested.   Insurance verification completed.   The patient is insured through [facility] .   Per test claim: PA required; PA submitted to above mentioned insurance via CoverMyMeds Key/confirmation #/EOC [id number] Status is pending

## 2024-02-29 NOTE — Telephone Encounter (Signed)
 Pharmacy Patient Advocate Encounter  Received notification from [employer] that Prior Authorization for Zepbound  2.5MG /0.5ML pen-injectors has been DENIED.  Full denial letter will be uploaded to the media tab. See denial reason below.   PA #/Case ID/Reference #: [id number]

## 2024-03-16 ENCOUNTER — Other Ambulatory Visit: Payer: Self-pay | Admitting: Internal Medicine

## 2024-03-16 NOTE — Telephone Encounter (Signed)
 Requested Prescriptions  Pending Prescriptions Disp Refills   busPIRone  (BUSPAR ) 5 MG tablet [Pharmacy Med Name: BUSPIRONE  5MG  TABLETS] 90 tablet 1    Sig: TAKE 1 TABLET(5 MG) BY MOUTH EVERY 8 HOURS AS NEEDED     Psychiatry: Anxiolytics/Hypnotics - Non-controlled Passed - 03/16/2024  5:14 PM      Passed - Valid encounter within last 12 months    Recent Outpatient Visits           2 months ago Encounter for general adult medical examination with abnormal findings   [place] [facility] [place], Angeline ORN, NP              Refused Prescriptions Disp Refills   sertraline  (ZOLOFT ) 100 MG tablet [Pharmacy Med Name: SERTRALINE  100MG  TABLETS] 90 tablet 0    Sig: TAKE 1 TABLET(100 MG) BY MOUTH DAILY     Psychiatry:  Antidepressants - SSRI - sertraline  Passed - 03/16/2024  5:14 PM      Passed - AST in normal range and within 360 days    AST  Date Value Ref Range Status  01/12/2024 18 10 - 35 U/L Final         Passed - ALT in normal range and within 360 days    ALT  Date Value Ref Range Status  01/12/2024 18 6 - 29 U/L Final         Passed - Completed PHQ-2 or PHQ-9 in the last 360 days      Passed - Valid encounter within last 6 months    Recent Outpatient Visits           2 months ago Encounter for general adult medical examination with abnormal findings   [facility] [place], Angeline ORN, NP

## 2024-04-10 LAB — HEMOGLOBIN A1C: Hemoglobin A1C: 5.8

## 2024-04-10 LAB — LIPID PANEL
Cholesterol: 161 (ref 0–200)
HDL: 43 (ref 35–70)
LDL Cholesterol: 96
LDl/HDL Ratio: 21
Triglycerides: 117 (ref 40–160)

## 2024-04-10 LAB — BASIC METABOLIC PANEL WITH GFR: Glucose: 98

## 2024-04-11 ENCOUNTER — Encounter: Payer: Self-pay | Admitting: Internal Medicine

## 2024-07-20 ENCOUNTER — Ambulatory Visit (INDEPENDENT_AMBULATORY_CARE_PROVIDER_SITE_OTHER): Admitting: Internal Medicine

## 2024-07-20 ENCOUNTER — Encounter: Payer: Self-pay | Admitting: Internal Medicine

## 2024-07-20 VITALS — BP 130/82 | Ht 63.0 in | Wt 225.0 lb

## 2024-07-20 DIAGNOSIS — R0681 Apnea, not elsewhere classified: Secondary | ICD-10-CM | POA: Diagnosis not present

## 2024-07-20 DIAGNOSIS — E782 Mixed hyperlipidemia: Secondary | ICD-10-CM | POA: Diagnosis not present

## 2024-07-20 DIAGNOSIS — F411 Generalized anxiety disorder: Secondary | ICD-10-CM

## 2024-07-20 DIAGNOSIS — R7303 Prediabetes: Secondary | ICD-10-CM

## 2024-07-20 DIAGNOSIS — E66812 Obesity, class 2: Secondary | ICD-10-CM

## 2024-07-20 DIAGNOSIS — Z6839 Body mass index (BMI) 39.0-39.9, adult: Secondary | ICD-10-CM

## 2024-07-20 DIAGNOSIS — E039 Hypothyroidism, unspecified: Secondary | ICD-10-CM

## 2024-07-20 DIAGNOSIS — K219 Gastro-esophageal reflux disease without esophagitis: Secondary | ICD-10-CM

## 2024-07-20 DIAGNOSIS — R0683 Snoring: Secondary | ICD-10-CM

## 2024-07-20 MED ORDER — BUSPIRONE HCL 5 MG PO TABS: 5.0000 mg | ORAL_TABLET | Freq: Three times a day (TID) | ORAL | 1 refills | Status: AC | PRN

## 2024-07-20 MED ORDER — SERTRALINE HCL 100 MG PO TABS: 100.0000 mg | ORAL_TABLET | Freq: Every day | ORAL | 0 refills | Status: AC

## 2024-07-20 NOTE — Progress Notes (Signed)
 Subjective:    Patient ID: Gloria Li, female    DOB: [birth year], 48 y.o.   MRN: [id number]  HPI  Patient presents to clinic today for follow-up of chronic conditions.  Hypothyroidism: She denies any issues on her current dose of levothyroxine .  She does not follow with endocrinology.  Anxiety: Chronic, managed on sertraline , buspirone  and alprazolam .  She has been prescribed hydroxyzine  in the past.  She is no longer seeing a therapist.  She denies depression, SI/HI.  HLD: Her last LDL was 43, triglycerides 838, 03/2024.  She denies myalgias on lovastatin .  She tries to consume a low-fat diet.  GERD: Triggered by tomato based foods.  She takes omeprazole  only as needed.  There is no upper GI on file.  Prediabetes: Her last A1c was 5.8%, 03/2024.  She is not taking any oral diabetic medication at this time.  She does not check her sugars.    She would also like a sleep study done.  She reports she snores like a freight train.  She reports her husband has also witnessed her stop breathing in her sleep.  Review of Systems     Past Medical History:  Diagnosis Date   Anxiety    BRCA negative 04/2017   MyRisk neg   Bursitis    Cancer [facility]) December 14 2022   Skin cancer basal cell   Family history of breast cancer    Genetic testing of female    BRCA negative   GERD (gastroesophageal reflux disease)    Hypothyroidism    Increased risk of breast cancer 04/2017   IBIS=23.65/riskscore=22%   Premature ovarian failure    Thyroid disease     Current Outpatient Medications  Medication Sig Dispense Refill   ALPRAZolam  (XANAX ) 0.5 MG tablet Take 1 tablet (0.5 mg total) by mouth daily as needed for anxiety. 20 tablet 0   azelastine (ASTELIN) 0.1 % nasal spray Place into the nose.     busPIRone  (BUSPAR ) 5 MG tablet TAKE 1 TABLET(5 MG) BY MOUTH EVERY 8 HOURS AS NEEDED 90 tablet 1   levothyroxine  (SYNTHROID ) 100 MCG tablet TAKE 1 TABLET(100 MCG) BY MOUTH DAILY BEFORE BREAKFAST  90 tablet 1   lovastatin  (MEVACOR ) 20 MG tablet TAKE 1 TABLET(20 MG) BY MOUTH AT BEDTIME 90 tablet 3   omeprazole  (PRILOSEC) 20 MG capsule Take 1 capsule (20 mg total) by mouth daily. 90 capsule 1   Probiotic Product (PROBIOTIC-10 PO) Take by mouth.     Semaglutide -Weight Management (WEGOVY ) 0.25 MG/0.5ML SOAJ Inject 0.25 mg into the skin once a week.     sertraline  (ZOLOFT ) 100 MG tablet Take 1 tablet (100 mg total) by mouth daily. 90 tablet 0   No current facility-administered medications for this visit.    Allergies  Allergen Reactions   Naltrexone -Bupropion  Hcl Er Dermatitis   Penicillins Rash    As an infant   Sulfa Antibiotics Rash    Family History  Problem Relation Age of Onset   Bone cancer Mother 27   Breast cancer Mother 41       second dx age 47   Lung cancer Mother 69   Cancer Mother    Glaucoma Father    COPD Father    Hypertension Maternal Grandmother    Heart failure Maternal Grandfather    Colon cancer Cousin 59    Social History   Socioeconomic History   Marital status: Married    Spouse name: Not on file   Number of children:  1   Years of education: Not on file   Highest education level: GED or equivalent  Occupational History   Occupation: Team leader-third party  Tobacco Use   Smoking status: Never   Smokeless tobacco: Never  Vaping Use   Vaping status: Never Used  Substance and Sexual Activity   Alcohol use: Not Currently   Drug use: Never   Sexual activity: Not Currently    Partners: Male    Birth control/protection: Post-menopausal, Other-see comments    Comment: vasectomy  Other Topics Concern   Not on file  Social History Narrative   Not on file   Social Drivers of Health   Financial Resource Strain: Low Risk  (07/17/2024)   Overall Financial Resource Strain (CARDIA)    Difficulty of Paying Living Expenses: Not hard at all  Food Insecurity: No Food Insecurity (07/17/2024)   Hunger Vital Sign    Worried About Running Out of Food  in the Last Year: Never true    Ran Out of Food in the Last Year: Never true  Transportation Needs: No Transportation Needs (07/17/2024)   PRAPARE - Administrator, Civil Service (Medical): No    Lack of Transportation (Non-Medical): No  Physical Activity: Inactive (07/17/2024)   Exercise Vital Sign    Days of Exercise per Week: 0 days    Minutes of Exercise per Session: Not on file  Stress: No Stress Concern Present (07/17/2024)   [employer] of Occupational Health - Occupational Stress Questionnaire    Feeling of Stress: Only a little  Social Connections: Socially Isolated (07/17/2024)   Social Connection and Isolation Panel    Frequency of Communication with Friends and Family: Once a week    Frequency of Social Gatherings with Friends and Family: Once a week    Attends Religious Services: Patient declined    Database Administrator or Organizations: No    Attends Engineer, Structural: Not on file    Marital Status: Married  Intimate Partner Violence: Patient Declined (07/09/2023)   Humiliation, Afraid, Rape, and Kick questionnaire    Fear of Current or Ex-Partner: Patient declined    Emotionally Abused: Patient declined    Physically Abused: Patient declined    Sexually Abused: Patient declined     Constitutional: Denies fever, malaise, fatigue, headache or abrupt weight changes.  HEENT: Denies eye pain, eye redness, ear pain, ringing in the ears, wax buildup, runny nose, nasal congestion, bloody nose, or sore throat. Respiratory: Denies difficulty breathing, shortness of breath, cough or sputum production.   Cardiovascular: Denies chest pain, chest tightness, palpitations or swelling in the hands or feet.  Gastrointestinal: Patient reports intermittent reflux.  Denies abdominal pain, bloating, constipation, diarrhea or blood in the stool.  GU: Denies urgency, frequency, pain with urination, burning sensation, blood in urine, odor or  discharge. Musculoskeletal: Denies decrease in range of motion, difficulty with gait, muscle pain or joint pain and swelling.  Skin: Denies redness, rashes, lesions or ulcercations.  Neurological: Patient reports witnessed apnea and snoring.  Denies difficulty with memory, difficulty with speech or problems with balance and coordination.  Psych: Patient has a history of anxiety.  Denies depression, SI/HI.  No other specific complaints in a complete review of systems (except as listed in HPI above).  Objective:  BP 130/82 (BP Location: Left Arm, Patient Position: Sitting, Cuff Size: Large)   Ht 5' 3 (1.6 m)   Wt 225 lb (102.1 kg)   LMP 05/19/2011   BMI  39.86 kg/m    Wt Readings from Last 3 Encounters:  01/12/24 228 lb 12.8 oz (103.8 kg)  07/09/23 233 lb 12.8 oz (106.1 kg)  01/01/23 227 lb (103 kg)    General: Appears her stated age, obese, in NAD. Skin: Warm, dry and intact.  Neck:  Neck supple, trachea midline. No masses, lumps or thyromegaly present.  Cardiovascular: Normal rate and rhythm. S1,S2 noted.  No murmur, rubs or gallops noted. No JVD or BLE edema.  Pulmonary/Chest: Normal effort and positive vesicular breath sounds. No respiratory distress. No wheezes, rales or ronchi noted.  Musculoskeletal:  No difficulty with gait.  Neurological: Alert and oriented.  Psychiatric: Mood and affect normal. Behavior is normal. Judgment and thought content normal.    BMET    Component Value Date/Time   NA 140 01/12/2024 1326   NA 141 07/12/2023 0706   K 4.3 01/12/2024 1326   CL 104 01/12/2024 1326   CO2 25 01/12/2024 1326   GLUCOSE 82 01/12/2024 1326   BUN 21 01/12/2024 1326   BUN 16 07/12/2023 0706   CREATININE 0.80 01/12/2024 1326   CALCIUM 9.8 01/12/2024 1326   GFRNONAA 76 08/30/2019 1501   GFRAA 88 08/30/2019 1501    Lipid Panel     Component Value Date/Time   CHOL 161 04/10/2024 0000   CHOL 152 07/12/2023 0706   CHOL 175 03/07/2018 0000   TRIG 117 04/10/2024  0000   TRIG 128 03/07/2018 0000   TRIG 128 03/07/2018 0000   HDL 43 04/10/2024 0000   HDL 40 07/12/2023 0706   HDL 41 03/07/2018 0000   CHOLHDL 3.6 01/12/2024 1326   LDLCALC 96 04/10/2024 0000   LDLCALC 82 01/12/2024 1326    CBC    Component Value Date/Time   WBC 7.8 01/12/2024 1326   RBC 5.55 (H) 01/12/2024 1326   HGB 15.5 01/12/2024 1326   HGB 14.7 07/12/2023 0706   HCT 48.5 (H) 01/12/2024 1326   HCT 45.7 07/12/2023 0706   PLT 253 01/12/2024 1326   PLT 270 07/12/2023 0706   MCV 87.4 01/12/2024 1326   MCV 88 07/12/2023 0706   MCH 27.9 01/12/2024 1326   MCHC 32.0 01/12/2024 1326   RDW 13.5 01/12/2024 1326   RDW 12.8 07/12/2023 0706   LYMPHSABS 1.8 12/01/2016 1131   EOSABS 0.1 12/01/2016 1131   BASOSABS 0.0 12/01/2016 1131    Hgb A1C Lab Results  Component Value Date   HGBA1C 5.8 04/10/2024            Assessment & Plan:   Snoring, witnessed apnea:  BMI 39.86 Neck circumference 17.5 inches ESS score of 4 Home sleep study ordered  RTC in 6 months for your annual exam Angeline Laura, NP

## 2024-07-20 NOTE — Assessment & Plan Note (Signed)
 TSH and free T4 today Continue levothyroxine  100 mcg daily, will adjust if needed based on labs

## 2024-07-20 NOTE — Assessment & Plan Note (Signed)
 Stable on sertraline  100 mg daily, buspirone  5 mg 3 times daily as needed and alprazolam  0.5 mg daily as needed Support offered

## 2024-07-20 NOTE — Patient Instructions (Signed)

## 2024-07-20 NOTE — Assessment & Plan Note (Signed)
 Complicated by obesity A1c today Encourage low-carb diet and exercise for weight loss

## 2024-07-20 NOTE — Assessment & Plan Note (Signed)
 Complicated by obesity C-Met and lipid profile today Encouraged her consume low-fat diet Continue lovastatin  20 mg daily, will adjust if needed to obtain goal of LDL <100

## 2024-07-20 NOTE — Assessment & Plan Note (Signed)
 Complicated by obesity Try to identify and avoid foods that trigger your reflux Encouraged weight loss as this can help reduce reflux symptoms Continue omeprazole  20 mg daily as needed

## 2024-07-20 NOTE — Assessment & Plan Note (Signed)
 Reinforced DASH diet and exercise for weight

## 2024-07-22 LAB — COMPREHENSIVE METABOLIC PANEL WITH GFR
ALT: 17 IU/L (ref 0–32)
AST: 19 IU/L (ref 0–40)
Albumin: 4.7 g/dL (ref 3.9–4.9)
Alkaline Phosphatase: 74 IU/L (ref 41–116)
BUN/Creatinine Ratio: 27 — ABNORMAL HIGH (ref 9–23)
BUN: 23 mg/dL (ref 6–24)
Bilirubin Total: 0.2 mg/dL (ref 0.0–1.2)
CO2: 21 mmol/L (ref 20–29)
Calcium: 9.7 mg/dL (ref 8.7–10.2)
Chloride: 104 mmol/L (ref 96–106)
Creatinine, Ser: 0.85 mg/dL (ref 0.57–1.00)
Globulin, Total: 2.3 g/dL (ref 1.5–4.5)
Glucose: 84 mg/dL (ref 70–99)
Potassium: 4.4 mmol/L (ref 3.5–5.2)
Sodium: 142 mmol/L (ref 134–144)
Total Protein: 7 g/dL (ref 6.0–8.5)
eGFR: 83 mL/min/1.73 (ref >59)

## 2024-07-22 LAB — CBC
Hematocrit: 44.8 % (ref 34.0–46.6)
Hemoglobin: 14.5 g/dL (ref 11.1–15.9)
MCH: 29.1 pg (ref 26.6–33.0)
MCHC: 32.4 g/dL (ref 31.5–35.7)
MCV: 90 fL (ref 79–97)
Platelets: 271 x10E3/uL (ref 150–450)
RBC: 4.99 x10E6/uL (ref 3.77–5.28)
RDW: 12.7 % (ref 11.7–15.4)
WBC: 8.6 x10E3/uL (ref 3.4–10.8)

## 2024-07-22 LAB — LIPID PANEL
Chol/HDL Ratio: 4 ratio (ref 0.0–4.4)
Cholesterol, Total: 175 mg/dL (ref 100–199)
HDL: 44 mg/dL (ref >39)
LDL Chol Calc (NIH): 102 mg/dL — ABNORMAL HIGH (ref 0–99)
Triglycerides: 164 mg/dL — ABNORMAL HIGH (ref 0–149)
VLDL Cholesterol Cal: 29 mg/dL (ref 5–40)

## 2024-07-22 LAB — TSH: TSH: 5.28 u[IU]/mL — ABNORMAL HIGH (ref 0.450–4.500)

## 2024-07-22 LAB — HEMOGLOBIN A1C
Est. average glucose Bld gHb Est-mCnc: 114 mg/dL
Hgb A1c MFr Bld: 5.6 % (ref 4.8–5.6)

## 2024-07-22 LAB — T4, FREE: Free T4: 1.61 ng/dL (ref 0.82–1.77)

## 2024-07-24 ENCOUNTER — Other Ambulatory Visit: Payer: Self-pay

## 2024-07-24 ENCOUNTER — Ambulatory Visit: Payer: Self-pay | Admitting: Internal Medicine

## 2024-07-24 MED ORDER — LEVOTHYROXINE SODIUM 100 MCG PO TABS: ORAL_TABLET | ORAL | 1 refills | Status: AC

## 2024-08-21 ENCOUNTER — Encounter: Payer: Self-pay | Admitting: Internal Medicine

## 2025-01-17 ENCOUNTER — Encounter: Admitting: Internal Medicine
